# Patient Record
Sex: Male | Born: 1935 | Race: White | Hispanic: No | Marital: Married
Health system: Southern US, Community
[De-identification: ages and names within clinical notes are randomized; demographics above are authoritative.]

---

## 2011-09-03 ENCOUNTER — Inpatient Hospital Stay: Payer: Self-pay | Admitting: Internal Medicine

## 2011-09-03 DIAGNOSIS — I359 Nonrheumatic aortic valve disorder, unspecified: Secondary | ICD-10-CM

## 2011-09-03 LAB — TSH: Thyroid Stimulating Horm: 2.72 u[IU]/mL

## 2011-09-03 LAB — BASIC METABOLIC PANEL
Anion Gap: 11 (ref 7–16)
BUN: 12 mg/dL (ref 7–18)
Calcium, Total: 10 mg/dL (ref 8.5–10.1)
Chloride: 106 mmol/L (ref 98–107)
Co2: 24 mmol/L (ref 21–32)
Creatinine: 1.13 mg/dL (ref 0.60–1.30)
Glucose: 104 mg/dL — ABNORMAL HIGH (ref 65–99)
Sodium: 141 mmol/L (ref 136–145)

## 2011-09-03 LAB — CBC WITH DIFFERENTIAL/PLATELET
Basophil #: 0 10*3/uL (ref 0.0–0.1)
Eosinophil #: 0.1 10*3/uL (ref 0.0–0.7)
Eosinophil %: 0.5 %
HCT: 44 % (ref 40.0–52.0)
HGB: 14.8 g/dL (ref 13.0–18.0)
Lymphocyte #: 1.7 10*3/uL (ref 1.0–3.6)
MCHC: 33.6 g/dL (ref 32.0–36.0)
MCV: 93 fL (ref 80–100)
Monocyte #: 1.9 x10 3/mm — ABNORMAL HIGH (ref 0.2–1.0)
Neutrophil #: 9.3 10*3/uL — ABNORMAL HIGH (ref 1.4–6.5)
Neutrophil %: 71.5 %
Platelet: 149 10*3/uL — ABNORMAL LOW (ref 150–440)
RBC: 4.74 10*6/uL (ref 4.40–5.90)
RDW: 12.8 % (ref 11.5–14.5)
WBC: 13 10*3/uL — ABNORMAL HIGH (ref 3.8–10.6)

## 2011-09-03 LAB — CK TOTAL AND CKMB (NOT AT ARMC)
CK, Total: 56 U/L (ref 35–232)
CK-MB: 1 ng/mL (ref 0.5–3.6)

## 2011-09-03 LAB — LIPID PANEL
Cholesterol: 163 mg/dL (ref 0–200)
Triglycerides: 69 mg/dL (ref 0–200)
VLDL Cholesterol, Calc: 14 mg/dL (ref 5–40)

## 2011-09-09 ENCOUNTER — Encounter: Payer: Self-pay | Admitting: Internal Medicine

## 2011-09-26 ENCOUNTER — Encounter: Payer: Self-pay | Admitting: Internal Medicine

## 2012-04-15 ENCOUNTER — Ambulatory Visit: Payer: Self-pay | Admitting: Internal Medicine

## 2012-12-27 ENCOUNTER — Emergency Department: Payer: Self-pay | Admitting: Emergency Medicine

## 2012-12-27 LAB — COMPREHENSIVE METABOLIC PANEL
Albumin: 3.7 g/dL (ref 3.4–5.0)
Alkaline Phosphatase: 121 U/L (ref 50–136)
Anion Gap: 5 — ABNORMAL LOW (ref 7–16)
Bilirubin,Total: 0.5 mg/dL (ref 0.2–1.0)
Calcium, Total: 10 mg/dL (ref 8.5–10.1)
Creatinine: 1.15 mg/dL (ref 0.60–1.30)
EGFR (African American): >60
EGFR (Non-African Amer.): >60
Glucose: 146 mg/dL — ABNORMAL HIGH (ref 65–99)
Osmolality: 284 (ref 275–301)
Potassium: 3.9 mmol/L (ref 3.5–5.1)
SGPT (ALT): 22 U/L (ref 12–78)
Sodium: 139 mmol/L (ref 136–145)
Total Protein: 6.9 g/dL (ref 6.4–8.2)

## 2012-12-27 LAB — CBC
HCT: 43.5 % (ref 40.0–52.0)
HGB: 14.6 g/dL (ref 13.0–18.0)
MCH: 31.4 pg (ref 26.0–34.0)
MCHC: 33.7 g/dL (ref 32.0–36.0)
RBC: 4.67 10*6/uL (ref 4.40–5.90)
RDW: 12.9 % (ref 11.5–14.5)
WBC: 11.2 10*3/uL — ABNORMAL HIGH (ref 3.8–10.6)

## 2013-02-02 ENCOUNTER — Ambulatory Visit: Payer: Self-pay | Admitting: Ophthalmology

## 2013-03-27 ENCOUNTER — Inpatient Hospital Stay: Payer: Self-pay | Admitting: Internal Medicine

## 2013-03-27 LAB — COMPREHENSIVE METABOLIC PANEL
Albumin: 3 g/dL — ABNORMAL LOW (ref 3.4–5.0)
Alkaline Phosphatase: 72 U/L
BUN: 53 mg/dL — ABNORMAL HIGH (ref 7–18)
Bilirubin,Total: 0.9 mg/dL (ref 0.2–1.0)
Creatinine: 2.24 mg/dL — ABNORMAL HIGH (ref 0.60–1.30)
EGFR (African American): 31 — ABNORMAL LOW
Osmolality: 289 (ref 275–301)
SGOT(AST): 48 U/L — ABNORMAL HIGH (ref 15–37)
Sodium: 138 mmol/L (ref 136–145)

## 2013-03-27 LAB — URINALYSIS, COMPLETE
Bilirubin,UR: NEGATIVE
Glucose,UR: NEGATIVE mg/dL (ref 0–75)
Hyaline Cast: 18
Ketone: NEGATIVE
Nitrite: NEGATIVE
Protein: 100
RBC,UR: 64 /HPF (ref 0–5)
Specific Gravity: 1.012 (ref 1.003–1.030)
Squamous Epithelial: <1

## 2013-03-27 LAB — CBC WITH DIFFERENTIAL/PLATELET
Basophil #: 0.1 10*3/uL (ref 0.0–0.1)
Basophil %: 0.2 %
Eosinophil #: 0 10*3/uL (ref 0.0–0.7)
HGB: 14.9 g/dL (ref 13.0–18.0)
Lymphocyte #: 0.7 10*3/uL — ABNORMAL LOW (ref 1.0–3.6)
Lymphocyte %: 2.5 %
MCH: 30.7 pg (ref 26.0–34.0)
MCHC: 33.6 g/dL (ref 32.0–36.0)
MCV: 91 fL (ref 80–100)
Monocyte %: 7.6 %
Neutrophil #: 24.1 10*3/uL — ABNORMAL HIGH (ref 1.4–6.5)
Neutrophil %: 89.7 %
Platelet: 91 10*3/uL — ABNORMAL LOW (ref 150–440)
RDW: 13.7 % (ref 11.5–14.5)

## 2013-03-27 LAB — APTT: Activated PTT: >160 secs (ref 23.6–35.9)

## 2013-03-27 LAB — TROPONIN I
Troponin-I: 0.29 ng/mL — ABNORMAL HIGH
Troponin-I: 0.25 ng/mL — ABNORMAL HIGH
Troponin-I: 0.2 ng/mL — ABNORMAL HIGH

## 2013-03-28 LAB — CBC WITH DIFFERENTIAL/PLATELET
Basophil #: 0 10*3/uL (ref 0.0–0.1)
Basophil %: 0.3 %
Eosinophil #: 0 10*3/uL (ref 0.0–0.7)
Eosinophil %: 0 %
HCT: 34.4 % — ABNORMAL LOW (ref 40.0–52.0)
HGB: 11.8 g/dL — ABNORMAL LOW (ref 13.0–18.0)
Lymphocyte #: 0.3 10*3/uL — ABNORMAL LOW (ref 1.0–3.6)
Lymphocyte %: 1.7 %
MCH: 31.5 pg (ref 26.0–34.0)
Monocyte #: 0.9 x10 3/mm (ref 0.2–1.0)
Monocyte %: 5.7 %
Neutrophil #: 14 10*3/uL — ABNORMAL HIGH (ref 1.4–6.5)
Neutrophil %: 92.3 %
RBC: 3.76 10*6/uL — ABNORMAL LOW (ref 4.40–5.90)
RDW: 13.7 % (ref 11.5–14.5)
WBC: 15.2 10*3/uL — ABNORMAL HIGH (ref 3.8–10.6)

## 2013-03-28 LAB — BASIC METABOLIC PANEL
Anion Gap: 6 — ABNORMAL LOW (ref 7–16)
BUN: 43 mg/dL — ABNORMAL HIGH (ref 7–18)
EGFR (African American): 46 — ABNORMAL LOW
EGFR (Non-African Amer.): 39 — ABNORMAL LOW
Osmolality: 288 (ref 275–301)
Sodium: 138 mmol/L (ref 136–145)

## 2013-03-28 LAB — APTT
Activated PTT: 159.5 secs — ABNORMAL HIGH (ref 23.6–35.9)
Activated PTT: 106.6 secs — ABNORMAL HIGH (ref 23.6–35.9)

## 2013-03-29 LAB — COMPREHENSIVE METABOLIC PANEL
Alkaline Phosphatase: 42 U/L — ABNORMAL LOW
Anion Gap: 5 — ABNORMAL LOW (ref 7–16)
Bilirubin,Total: 0.4 mg/dL (ref 0.2–1.0)
Calcium, Total: 9.6 mg/dL (ref 8.5–10.1)
Chloride: 108 mmol/L — ABNORMAL HIGH (ref 98–107)
Co2: 27 mmol/L (ref 21–32)
Creatinine: 1.95 mg/dL — ABNORMAL HIGH (ref 0.60–1.30)
EGFR (African American): 37 — ABNORMAL LOW
EGFR (Non-African Amer.): 32 — ABNORMAL LOW
Osmolality: 291 (ref 275–301)
Potassium: 4.2 mmol/L (ref 3.5–5.1)
SGOT(AST): 24 U/L (ref 15–37)
SGPT (ALT): 29 U/L (ref 12–78)
Sodium: 140 mmol/L (ref 136–145)
Total Protein: 4.1 g/dL — ABNORMAL LOW (ref 6.4–8.2)

## 2013-03-29 LAB — APTT
Activated PTT: >160 secs (ref 23.6–35.9)
Activated PTT: 112.5 secs — ABNORMAL HIGH (ref 23.6–35.9)
Activated PTT: 113.6 secs — ABNORMAL HIGH (ref 23.6–35.9)

## 2013-03-30 LAB — HEMOGLOBIN: HGB: 6.1 g/dL — ABNORMAL LOW (ref 13.0–18.0)

## 2013-03-30 LAB — BASIC METABOLIC PANEL
Chloride: 109 mmol/L — ABNORMAL HIGH (ref 98–107)
Co2: 25 mmol/L (ref 21–32)
Glucose: 132 mg/dL — ABNORMAL HIGH (ref 65–99)
Potassium: 4.3 mmol/L (ref 3.5–5.1)
Sodium: 139 mmol/L (ref 136–145)

## 2013-03-30 LAB — PHOSPHORUS: Phosphorus: 3 mg/dL (ref 2.5–4.9)

## 2013-03-30 LAB — URIC ACID: Uric Acid: 9.1 mg/dL — ABNORMAL HIGH (ref 3.5–7.2)

## 2013-03-30 LAB — APTT: Activated PTT: 72.8 secs — ABNORMAL HIGH (ref 23.6–35.9)

## 2013-03-30 LAB — CK: CK, Total: 246 U/L — ABNORMAL HIGH (ref 35–232)

## 2013-03-31 LAB — PROTIME-INR: INR: 1.1

## 2013-03-31 LAB — RENAL FUNCTION PANEL
Albumin: 2.3 g/dL — ABNORMAL LOW (ref 3.4–5.0)
BUN: 40 mg/dL — ABNORMAL HIGH (ref 7–18)
Co2: 25 mmol/L (ref 21–32)
Creatinine: 1.84 mg/dL — ABNORMAL HIGH (ref 0.60–1.30)
EGFR (African American): 40 — ABNORMAL LOW
EGFR (Non-African Amer.): 34 — ABNORMAL LOW
Osmolality: 291 (ref 275–301)
Potassium: 4.4 mmol/L (ref 3.5–5.1)
Sodium: 141 mmol/L (ref 136–145)

## 2013-03-31 LAB — CBC WITH DIFFERENTIAL/PLATELET
Bands: 3 %
HCT: 25.8 % — ABNORMAL LOW (ref 40.0–52.0)
HCT: 23.6 % — ABNORMAL LOW (ref 40.0–52.0)
HGB: 8.4 g/dL — ABNORMAL LOW (ref 13.0–18.0)
MCH: 29.9 pg (ref 26.0–34.0)
MCH: 30.8 pg (ref 26.0–34.0)
MCHC: 32.7 g/dL (ref 32.0–36.0)
MCHC: 34.2 g/dL (ref 32.0–36.0)
Metamyelocyte: 1 %
Metamyelocyte: 1 %
Myelocyte: 2 %
Platelet: 87 10*3/uL — ABNORMAL LOW (ref 150–440)
RBC: 2.82 10*6/uL — ABNORMAL LOW (ref 4.40–5.90)
RBC: 2.62 10*6/uL — ABNORMAL LOW (ref 4.40–5.90)
RDW: 13.9 % (ref 11.5–14.5)
Segmented Neutrophils: 87 %
Segmented Neutrophils: 87 %
WBC: 15.7 10*3/uL — ABNORMAL HIGH (ref 3.8–10.6)

## 2013-03-31 LAB — PROTEIN ELECTROPHORESIS(ARMC)

## 2013-03-31 LAB — URINALYSIS, COMPLETE
Bilirubin,UR: NEGATIVE
Glucose,UR: NEGATIVE mg/dL (ref 0–75)
Ketone: NEGATIVE
Leukocyte Esterase: NEGATIVE
Ph: 5 (ref 4.5–8.0)
Protein: NEGATIVE
RBC,UR: 2 /HPF (ref 0–5)
Specific Gravity: 1.006 (ref 1.003–1.030)
Squamous Epithelial: NONE SEEN
WBC UR: 1 /HPF (ref 0–5)

## 2013-04-01 LAB — CBC WITH DIFFERENTIAL/PLATELET
Basophil #: 0 10*3/uL (ref 0.0–0.1)
Eosinophil #: 0 10*3/uL (ref 0.0–0.7)
Eosinophil %: 0 %
HCT: 20.3 % — ABNORMAL LOW (ref 40.0–52.0)
Lymphocyte #: 0.4 10*3/uL — ABNORMAL LOW (ref 1.0–3.6)
MCHC: 33.8 g/dL (ref 32.0–36.0)
MCV: 91 fL (ref 80–100)
Monocyte #: 0.7 x10 3/mm (ref 0.2–1.0)
Monocyte %: 6.5 %
Platelet: 77 10*3/uL — ABNORMAL LOW (ref 150–440)
RBC: 2.24 10*6/uL — ABNORMAL LOW (ref 4.40–5.90)
RDW: 14.1 % (ref 11.5–14.5)
WBC: 11.1 10*3/uL — ABNORMAL HIGH (ref 3.8–10.6)

## 2013-04-01 LAB — RENAL FUNCTION PANEL
Albumin: 1.9 g/dL — ABNORMAL LOW (ref 3.4–5.0)
Anion Gap: 4 — ABNORMAL LOW (ref 7–16)
Calcium, Total: 9.7 mg/dL (ref 8.5–10.1)
Chloride: 109 mmol/L — ABNORMAL HIGH (ref 98–107)
Co2: 27 mmol/L (ref 21–32)
Creatinine: 1.4 mg/dL — ABNORMAL HIGH (ref 0.60–1.30)
EGFR (African American): 55 — ABNORMAL LOW
EGFR (Non-African Amer.): 48 — ABNORMAL LOW
Osmolality: 286 (ref 275–301)
Phosphorus: 2.3 mg/dL — ABNORMAL LOW (ref 2.5–4.9)
Potassium: 4.2 mmol/L (ref 3.5–5.1)

## 2013-04-01 LAB — COMPREHENSIVE METABOLIC PANEL
Alkaline Phosphatase: 40 U/L — ABNORMAL LOW
Bilirubin,Total: 0.5 mg/dL (ref 0.2–1.0)
SGOT(AST): 49 U/L — ABNORMAL HIGH (ref 15–37)

## 2013-04-01 LAB — CULTURE, BLOOD (SINGLE)

## 2013-04-02 LAB — BASIC METABOLIC PANEL
Anion Gap: 3 — ABNORMAL LOW (ref 7–16)
Chloride: 110 mmol/L — ABNORMAL HIGH (ref 98–107)
Creatinine: 1.28 mg/dL (ref 0.60–1.30)
EGFR (African American): >60
Osmolality: 287 (ref 275–301)
Sodium: 140 mmol/L (ref 136–145)

## 2013-04-02 LAB — CBC WITH DIFFERENTIAL/PLATELET
Basophil #: 0 10*3/uL (ref 0.0–0.1)
Eosinophil #: 0 10*3/uL (ref 0.0–0.7)
Eosinophil %: 0 %
Lymphocyte %: 2.7 %
MCV: 90 fL (ref 80–100)
Monocyte #: 0.9 x10 3/mm (ref 0.2–1.0)
Monocyte %: 7 %
Neutrophil #: 10.9 10*3/uL — ABNORMAL HIGH (ref 1.4–6.5)
RBC: 3.06 10*6/uL — ABNORMAL LOW (ref 4.40–5.90)
WBC: 12.1 10*3/uL — ABNORMAL HIGH (ref 3.8–10.6)

## 2013-04-03 LAB — CBC WITH DIFFERENTIAL/PLATELET
Bands: 3 %
HGB: 9.8 g/dL — ABNORMAL LOW (ref 13.0–18.0)
MCH: 31.2 pg (ref 26.0–34.0)
Monocytes: 7 %
Myelocyte: 1 %
RBC: 3.13 10*6/uL — ABNORMAL LOW (ref 4.40–5.90)
Segmented Neutrophils: 82 %
WBC: 13.1 10*3/uL — ABNORMAL HIGH (ref 3.8–10.6)

## 2013-04-03 LAB — BASIC METABOLIC PANEL
Anion Gap: 5 — ABNORMAL LOW (ref 7–16)
Calcium, Total: 9.8 mg/dL (ref 8.5–10.1)
Co2: 25 mmol/L (ref 21–32)
EGFR (African American): >60
Glucose: 98 mg/dL (ref 65–99)
Potassium: 3.9 mmol/L (ref 3.5–5.1)

## 2013-04-04 LAB — CBC WITH DIFFERENTIAL/PLATELET
Eosinophil #: 0 10*3/uL (ref 0.0–0.7)
HCT: 31.4 % — ABNORMAL LOW (ref 40.0–52.0)
Lymphocyte #: 0.2 10*3/uL — ABNORMAL LOW (ref 1.0–3.6)
Lymphocyte %: 1.7 %
MCHC: 34.2 g/dL (ref 32.0–36.0)
MCV: 91 fL (ref 80–100)
Neutrophil #: 13.1 10*3/uL — ABNORMAL HIGH (ref 1.4–6.5)
Neutrophil %: 93.1 %
Platelet: 105 10*3/uL — ABNORMAL LOW (ref 150–440)
RBC: 3.47 10*6/uL — ABNORMAL LOW (ref 4.40–5.90)
WBC: 14.1 10*3/uL — ABNORMAL HIGH (ref 3.8–10.6)

## 2013-04-04 LAB — BASIC METABOLIC PANEL
Anion Gap: 6 — ABNORMAL LOW (ref 7–16)
BUN: 25 mg/dL — ABNORMAL HIGH (ref 7–18)
Calcium, Total: 10.2 mg/dL — ABNORMAL HIGH (ref 8.5–10.1)
Co2: 26 mmol/L (ref 21–32)
Creatinine: 1.24 mg/dL (ref 0.60–1.30)
EGFR (African American): >60
EGFR (Non-African Amer.): 55 — ABNORMAL LOW
Glucose: 96 mg/dL (ref 65–99)
Potassium: 4.2 mmol/L (ref 3.5–5.1)

## 2013-04-06 ENCOUNTER — Inpatient Hospital Stay: Payer: Self-pay | Admitting: Internal Medicine

## 2013-04-06 LAB — URINALYSIS, COMPLETE
Bilirubin,UR: NEGATIVE
Glucose,UR: NEGATIVE mg/dL (ref 0–75)
Ketone: NEGATIVE
Nitrite: NEGATIVE
Ph: 5 (ref 4.5–8.0)
Protein: NEGATIVE
RBC,UR: 23 /HPF (ref 0–5)
Specific Gravity: 1.014 (ref 1.003–1.030)
Squamous Epithelial: NONE SEEN
WBC UR: 1 /HPF (ref 0–5)

## 2013-04-06 LAB — COMPREHENSIVE METABOLIC PANEL
Albumin: 2.8 g/dL — ABNORMAL LOW (ref 3.4–5.0)
Alkaline Phosphatase: 72 U/L
BUN: 32 mg/dL — ABNORMAL HIGH (ref 7–18)
Calcium, Total: 10.9 mg/dL — ABNORMAL HIGH (ref 8.5–10.1)
Creatinine: 2.06 mg/dL — ABNORMAL HIGH (ref 0.60–1.30)
EGFR (Non-African Amer.): 30 — ABNORMAL LOW
Glucose: 138 mg/dL — ABNORMAL HIGH (ref 65–99)
Osmolality: 296 (ref 275–301)
SGPT (ALT): 37 U/L (ref 12–78)
Sodium: 144 mmol/L (ref 136–145)

## 2013-04-06 LAB — CBC WITH DIFFERENTIAL/PLATELET
Basophil %: 0.4 %
Eosinophil #: 0 10*3/uL (ref 0.0–0.7)
Eosinophil %: 0 %
HGB: 12.1 g/dL — ABNORMAL LOW (ref 13.0–18.0)
Lymphocyte #: 0.4 10*3/uL — ABNORMAL LOW (ref 1.0–3.6)
MCH: 30.4 pg (ref 26.0–34.0)
MCHC: 33 g/dL (ref 32.0–36.0)
Monocyte #: 1.8 x10 3/mm — ABNORMAL HIGH (ref 0.2–1.0)
Neutrophil %: 91.2 %
Platelet: 124 10*3/uL — ABNORMAL LOW (ref 150–440)
RDW: 15.1 % — ABNORMAL HIGH (ref 11.5–14.5)
WBC: 26.4 10*3/uL — ABNORMAL HIGH (ref 3.8–10.6)

## 2013-04-06 LAB — TROPONIN I: Troponin-I: 0.21 ng/mL — ABNORMAL HIGH

## 2013-04-07 ENCOUNTER — Ambulatory Visit: Payer: Self-pay | Admitting: Internal Medicine

## 2013-04-07 LAB — COMPREHENSIVE METABOLIC PANEL
Albumin: 2.5 g/dL — ABNORMAL LOW (ref 3.4–5.0)
Alkaline Phosphatase: 67 U/L
BUN: 40 mg/dL — ABNORMAL HIGH (ref 7–18)
Bilirubin,Total: 1.4 mg/dL — ABNORMAL HIGH (ref 0.2–1.0)
Calcium, Total: 9.9 mg/dL (ref 8.5–10.1)
Chloride: 113 mmol/L — ABNORMAL HIGH (ref 98–107)
Co2: 20 mmol/L — ABNORMAL LOW (ref 21–32)
Creatinine: 2.48 mg/dL — ABNORMAL HIGH (ref 0.60–1.30)
EGFR (Non-African Amer.): 24 — ABNORMAL LOW
Glucose: 151 mg/dL — ABNORMAL HIGH (ref 65–99)
Potassium: 3.7 mmol/L (ref 3.5–5.1)
Sodium: 143 mmol/L (ref 136–145)

## 2013-04-07 LAB — PROTIME-INR
INR: 1.4
Prothrombin Time: 16.9 secs — ABNORMAL HIGH (ref 11.5–14.7)

## 2013-04-07 LAB — CBC WITH DIFFERENTIAL/PLATELET
Basophil #: 0.2 10*3/uL — ABNORMAL HIGH (ref 0.0–0.1)
Basophil %: 0.5 %
Eosinophil %: 0 %
HGB: 11.2 g/dL — ABNORMAL LOW (ref 13.0–18.0)
Lymphocyte #: 0.3 10*3/uL — ABNORMAL LOW (ref 1.0–3.6)
MCH: 30.6 pg (ref 26.0–34.0)
MCHC: 32.9 g/dL (ref 32.0–36.0)
MCV: 93 fL (ref 80–100)
Monocyte #: 1.4 x10 3/mm — ABNORMAL HIGH (ref 0.2–1.0)
Monocyte %: 4.5 %
Neutrophil #: 30 10*3/uL — ABNORMAL HIGH (ref 1.4–6.5)
Neutrophil %: 94.2 %
Platelet: 96 10*3/uL — ABNORMAL LOW (ref 150–440)
RBC: 3.66 10*6/uL — ABNORMAL LOW (ref 4.40–5.90)

## 2013-04-07 LAB — TROPONIN I
Troponin-I: 0.21 ng/mL — ABNORMAL HIGH
Troponin-I: 0.22 ng/mL — ABNORMAL HIGH

## 2013-04-08 LAB — BASIC METABOLIC PANEL
BUN: 52 mg/dL — ABNORMAL HIGH (ref 7–18)
Co2: 21 mmol/L (ref 21–32)
Creatinine: 2.58 mg/dL — ABNORMAL HIGH (ref 0.60–1.30)
EGFR (African American): 26 — ABNORMAL LOW
Glucose: 115 mg/dL — ABNORMAL HIGH (ref 65–99)

## 2013-04-08 LAB — CBC WITH DIFFERENTIAL/PLATELET
Bands: 10 %
HCT: 34.6 % — ABNORMAL LOW (ref 40.0–52.0)
MCV: 93 fL (ref 80–100)
Monocytes: 11 %
Segmented Neutrophils: 75 %
WBC: 33 10*3/uL — ABNORMAL HIGH (ref 3.8–10.6)

## 2013-04-08 LAB — URINE CULTURE

## 2013-04-09 LAB — CBC WITH DIFFERENTIAL/PLATELET
Eosinophil #: 0.1 10*3/uL (ref 0.0–0.7)
Eosinophil %: 0.3 %
HGB: 11.1 g/dL — ABNORMAL LOW (ref 13.0–18.0)
Lymphocyte #: 0.3 10*3/uL — ABNORMAL LOW (ref 1.0–3.6)
Lymphocyte %: 1.2 %
MCH: 31.4 pg (ref 26.0–34.0)
MCHC: 33.4 g/dL (ref 32.0–36.0)
Monocyte #: 1.1 x10 3/mm — ABNORMAL HIGH (ref 0.2–1.0)
Neutrophil #: 23 10*3/uL — ABNORMAL HIGH (ref 1.4–6.5)
RBC: 3.53 10*6/uL — ABNORMAL LOW (ref 4.40–5.90)
RDW: 15.4 % — ABNORMAL HIGH (ref 11.5–14.5)
WBC: 24.7 10*3/uL — ABNORMAL HIGH (ref 3.8–10.6)

## 2013-04-09 LAB — BASIC METABOLIC PANEL
BUN: 59 mg/dL — ABNORMAL HIGH (ref 7–18)
Calcium, Total: 10.7 mg/dL — ABNORMAL HIGH (ref 8.5–10.1)
Chloride: 116 mmol/L — ABNORMAL HIGH (ref 98–107)
EGFR (African American): 27 — ABNORMAL LOW
EGFR (Non-African Amer.): 24 — ABNORMAL LOW
Glucose: 112 mg/dL — ABNORMAL HIGH (ref 65–99)
Osmolality: 306 (ref 275–301)
Potassium: 3.8 mmol/L (ref 3.5–5.1)

## 2013-04-10 LAB — BASIC METABOLIC PANEL
Anion Gap: 7 (ref 7–16)
BUN: 53 mg/dL — ABNORMAL HIGH (ref 7–18)
Chloride: 116 mmol/L — ABNORMAL HIGH (ref 98–107)
Co2: 22 mmol/L (ref 21–32)
Creatinine: 1.89 mg/dL — ABNORMAL HIGH (ref 0.60–1.30)
Glucose: 107 mg/dL — ABNORMAL HIGH (ref 65–99)
Potassium: 3.6 mmol/L (ref 3.5–5.1)

## 2013-04-10 LAB — CBC WITH DIFFERENTIAL/PLATELET
Eosinophil #: 0 10*3/uL (ref 0.0–0.7)
HGB: 11.1 g/dL — ABNORMAL LOW (ref 13.0–18.0)
Lymphocyte %: 1.3 %
MCH: 31.1 pg (ref 26.0–34.0)
MCHC: 33.2 g/dL (ref 32.0–36.0)
MCV: 94 fL (ref 80–100)
Neutrophil %: 92.5 %
Platelet: 58 10*3/uL — ABNORMAL LOW (ref 150–440)
RBC: 3.56 10*6/uL — ABNORMAL LOW (ref 4.40–5.90)

## 2013-04-11 LAB — CBC WITH DIFFERENTIAL/PLATELET
Basophil %: 0.2 %
Eosinophil #: 0 10*3/uL (ref 0.0–0.7)
Eosinophil %: 0 %
HGB: 10.7 g/dL — ABNORMAL LOW (ref 13.0–18.0)
Lymphocyte %: 1.8 %
MCH: 31.2 pg (ref 26.0–34.0)
MCHC: 33.4 g/dL (ref 32.0–36.0)
MCV: 93 fL (ref 80–100)
Monocyte #: 0.7 x10 3/mm (ref 0.2–1.0)
Monocyte %: 6.6 %
Neutrophil #: 10 10*3/uL — ABNORMAL HIGH (ref 1.4–6.5)
Neutrophil %: 91.4 %
RBC: 3.45 10*6/uL — ABNORMAL LOW (ref 4.40–5.90)
RDW: 15.7 % — ABNORMAL HIGH (ref 11.5–14.5)
WBC: 11 10*3/uL — ABNORMAL HIGH (ref 3.8–10.6)

## 2013-04-11 LAB — COMPREHENSIVE METABOLIC PANEL
Alkaline Phosphatase: 66 U/L
Anion Gap: 4 — ABNORMAL LOW (ref 7–16)
Calcium, Total: 11 mg/dL — ABNORMAL HIGH (ref 8.5–10.1)
Chloride: 117 mmol/L — ABNORMAL HIGH (ref 98–107)
Creatinine: 1.47 mg/dL — ABNORMAL HIGH (ref 0.60–1.30)
EGFR (African American): 52 — ABNORMAL LOW
Glucose: 115 mg/dL — ABNORMAL HIGH (ref 65–99)
SGOT(AST): 40 U/L — ABNORMAL HIGH (ref 15–37)
SGPT (ALT): 31 U/L (ref 12–78)
Sodium: 144 mmol/L (ref 136–145)
Total Protein: 5 g/dL — ABNORMAL LOW (ref 6.4–8.2)

## 2013-04-11 LAB — VANCOMYCIN, TROUGH: Vancomycin, Trough: 16 ug/mL (ref 10–20)

## 2013-04-11 LAB — CULTURE, BLOOD (SINGLE)

## 2013-04-12 LAB — CBC WITH DIFFERENTIAL/PLATELET
Basophil #: 0 10*3/uL (ref 0.0–0.1)
Basophil %: 0.4 %
Eosinophil #: 0 10*3/uL (ref 0.0–0.7)
Eosinophil %: 0 %
HGB: 10.2 g/dL — ABNORMAL LOW (ref 13.0–18.0)
Lymphocyte #: 0.2 10*3/uL — ABNORMAL LOW (ref 1.0–3.6)
MCH: 31.4 pg (ref 26.0–34.0)
MCHC: 34.2 g/dL (ref 32.0–36.0)
Monocyte %: 7.4 %
Neutrophil #: 10 10*3/uL — ABNORMAL HIGH (ref 1.4–6.5)
Platelet: 55 10*3/uL — ABNORMAL LOW (ref 150–440)
RBC: 3.24 10*6/uL — ABNORMAL LOW (ref 4.40–5.90)
RDW: 15.8 % — ABNORMAL HIGH (ref 11.5–14.5)
WBC: 11 10*3/uL — ABNORMAL HIGH (ref 3.8–10.6)

## 2013-04-12 LAB — BASIC METABOLIC PANEL
BUN: 35 mg/dL — ABNORMAL HIGH (ref 7–18)
Chloride: 116 mmol/L — ABNORMAL HIGH (ref 98–107)
Creatinine: 1.1 mg/dL (ref 0.60–1.30)
EGFR (African American): >60
EGFR (Non-African Amer.): >60
Osmolality: 298 (ref 275–301)
Potassium: 3.3 mmol/L — ABNORMAL LOW (ref 3.5–5.1)
Sodium: 145 mmol/L (ref 136–145)

## 2013-04-13 LAB — BASIC METABOLIC PANEL
Anion Gap: 6 — ABNORMAL LOW (ref 7–16)
BUN: 28 mg/dL — ABNORMAL HIGH (ref 7–18)
Calcium, Total: 9.8 mg/dL (ref 8.5–10.1)
Co2: 22 mmol/L (ref 21–32)
EGFR (Non-African Amer.): >60
Potassium: 3.1 mmol/L — ABNORMAL LOW (ref 3.5–5.1)
Sodium: 140 mmol/L (ref 136–145)

## 2013-04-13 LAB — CBC WITH DIFFERENTIAL/PLATELET
Basophil %: 0.1 %
Eosinophil #: 0 10*3/uL (ref 0.0–0.7)
Lymphocyte #: 0.2 10*3/uL — ABNORMAL LOW (ref 1.0–3.6)
Lymphocyte %: 2.6 %
MCHC: 34.1 g/dL (ref 32.0–36.0)
MCV: 93 fL (ref 80–100)
Monocyte #: 0.7 x10 3/mm (ref 0.2–1.0)
Monocyte %: 7.4 %
Neutrophil #: 8.6 10*3/uL — ABNORMAL HIGH (ref 1.4–6.5)
RBC: 3.03 10*6/uL — ABNORMAL LOW (ref 4.40–5.90)

## 2013-04-14 LAB — CBC WITH DIFFERENTIAL/PLATELET
Basophil #: 0 10*3/uL (ref 0.0–0.1)
Eosinophil %: 0.2 %
HGB: 10.8 g/dL — ABNORMAL LOW (ref 13.0–18.0)
Lymphocyte %: 3.7 %
MCH: 30.6 pg (ref 26.0–34.0)
MCHC: 33.3 g/dL (ref 32.0–36.0)
MCV: 92 fL (ref 80–100)
Monocyte #: 0.8 x10 3/mm (ref 0.2–1.0)
Neutrophil #: 11.4 10*3/uL — ABNORMAL HIGH (ref 1.4–6.5)
Neutrophil %: 90 %
Platelet: 76 10*3/uL — ABNORMAL LOW (ref 150–440)
RDW: 15.6 % — ABNORMAL HIGH (ref 11.5–14.5)
WBC: 12.7 10*3/uL — ABNORMAL HIGH (ref 3.8–10.6)

## 2013-04-14 LAB — MAGNESIUM: Magnesium: 1.6 mg/dL — ABNORMAL LOW

## 2013-04-14 LAB — BASIC METABOLIC PANEL
Anion Gap: 6 — ABNORMAL LOW (ref 7–16)
BUN: 24 mg/dL — ABNORMAL HIGH (ref 7–18)
Calcium, Total: 10.5 mg/dL — ABNORMAL HIGH (ref 8.5–10.1)
Chloride: 113 mmol/L — ABNORMAL HIGH (ref 98–107)
EGFR (African American): >60
Glucose: 126 mg/dL — ABNORMAL HIGH (ref 65–99)
Osmolality: 291 (ref 275–301)
Potassium: 3 mmol/L — ABNORMAL LOW (ref 3.5–5.1)
Sodium: 143 mmol/L (ref 136–145)

## 2013-04-14 LAB — POTASSIUM: Potassium: 4 mmol/L (ref 3.5–5.1)

## 2013-04-15 LAB — BASIC METABOLIC PANEL
Chloride: 114 mmol/L — ABNORMAL HIGH (ref 98–107)
Creatinine: 1 mg/dL (ref 0.60–1.30)
EGFR (African American): >60
EGFR (Non-African Amer.): >60
Osmolality: 288 (ref 275–301)
Potassium: 3.7 mmol/L (ref 3.5–5.1)

## 2013-04-15 LAB — CBC WITH DIFFERENTIAL/PLATELET
Basophil %: 0.1 %
Eosinophil %: 0.2 %
HCT: 30.8 % — ABNORMAL LOW (ref 40.0–52.0)
HGB: 10.3 g/dL — ABNORMAL LOW (ref 13.0–18.0)
Lymphocyte #: 0.3 10*3/uL — ABNORMAL LOW (ref 1.0–3.6)
MCH: 30.9 pg (ref 26.0–34.0)
MCHC: 33.6 g/dL (ref 32.0–36.0)
Monocyte #: 0.7 x10 3/mm (ref 0.2–1.0)
Monocyte %: 5.8 %
Neutrophil %: 91.3 %
Platelet: 69 10*3/uL — ABNORMAL LOW (ref 150–440)
WBC: 11.5 10*3/uL — ABNORMAL HIGH (ref 3.8–10.6)

## 2013-04-16 LAB — CBC WITH DIFFERENTIAL/PLATELET
Basophil #: 0 10*3/uL (ref 0.0–0.1)
Basophil %: 0.2 %
Eosinophil #: 0 10*3/uL (ref 0.0–0.7)
Eosinophil %: 0 %
HCT: 35.6 % — ABNORMAL LOW (ref 40.0–52.0)
HGB: 11.6 g/dL — ABNORMAL LOW (ref 13.0–18.0)
Lymphocyte %: 1.6 %
MCV: 93 fL (ref 80–100)
Monocyte %: 5.7 %
Neutrophil %: 92.5 %
RBC: 3.84 10*6/uL — ABNORMAL LOW (ref 4.40–5.90)
RDW: 16 % — ABNORMAL HIGH (ref 11.5–14.5)
WBC: 21.9 10*3/uL — ABNORMAL HIGH (ref 3.8–10.6)

## 2013-04-16 LAB — BASIC METABOLIC PANEL
Anion Gap: 5 — ABNORMAL LOW (ref 7–16)
BUN: 28 mg/dL — ABNORMAL HIGH (ref 7–18)
Calcium, Total: 11.2 mg/dL — ABNORMAL HIGH (ref 8.5–10.1)
Chloride: 112 mmol/L — ABNORMAL HIGH (ref 98–107)
EGFR (African American): 59 — ABNORMAL LOW
Glucose: 87 mg/dL (ref 65–99)
Osmolality: 290 (ref 275–301)
Potassium: 3.9 mmol/L (ref 3.5–5.1)
Sodium: 143 mmol/L (ref 136–145)

## 2013-04-27 ENCOUNTER — Ambulatory Visit: Payer: Self-pay | Admitting: Internal Medicine

## 2013-04-27 DEATH — deceased

## 2014-04-17 IMAGING — CT CT HEAD WITHOUT CONTRAST
1 series · 16 of 30 positions shown, 20 images · non-contrast
Comparison: 03/27/2013

CLINICAL DATA: Confusion. Left facial droop and altered mental
status.

EXAM:
CT HEAD WITHOUT CONTRAST
TECHNIQUE: Contiguous axial images were obtained from the base of the skull
through the vertex without intravenous contrast.

[Series 2: head wo · axial · 0.48mm/px · z∈[-11,+142]mm · 16 of 36 slices shown, 20 images]
[im 2/36  brain]
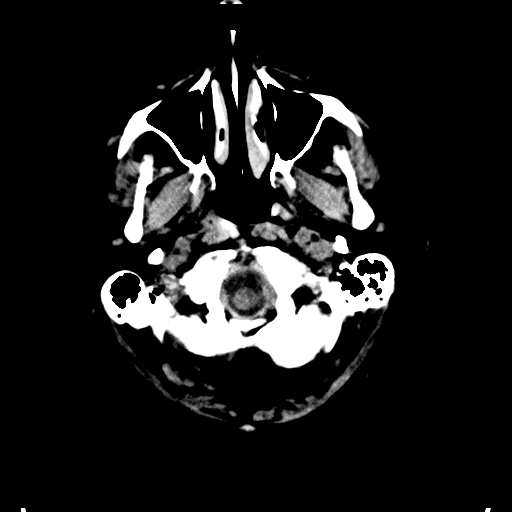
[im 2/36  bone]
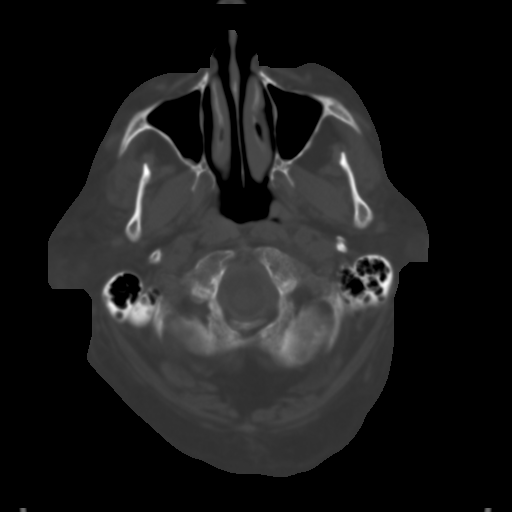
[im 4/36  brain]
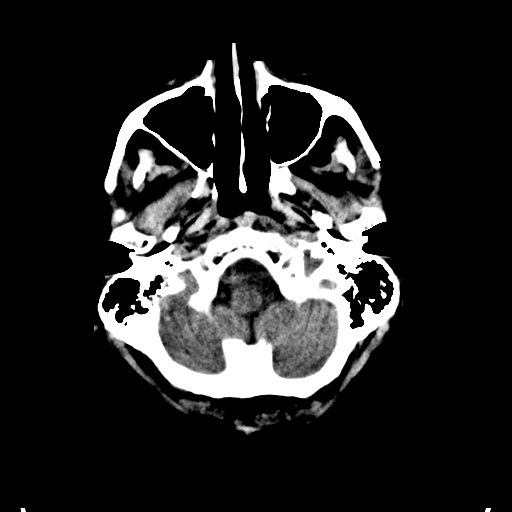
[im 7/36  brain]
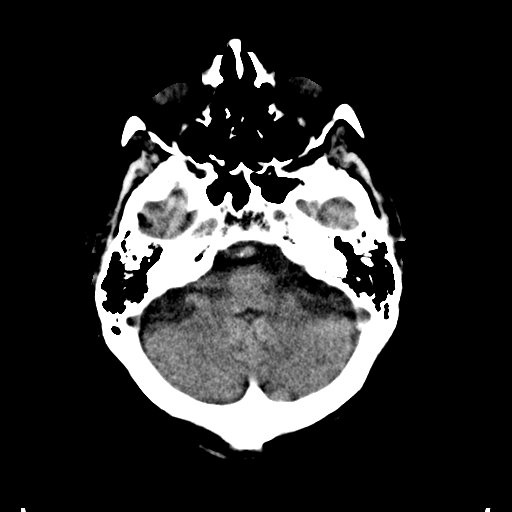
[im 9/36  brain]
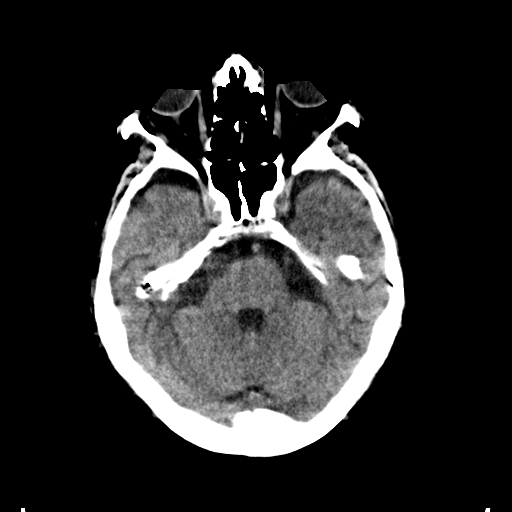
[im 10/36  brain]
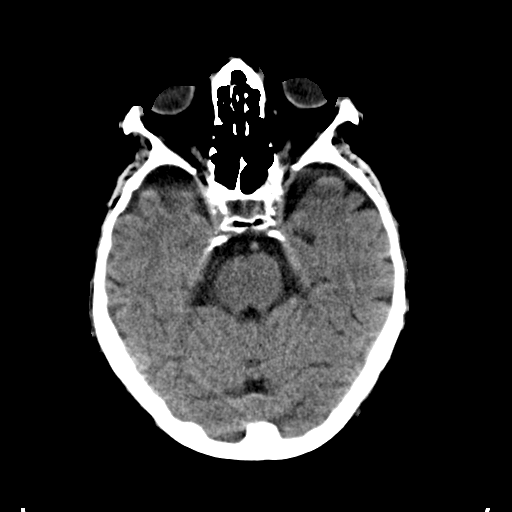
[im 10/36  bone]
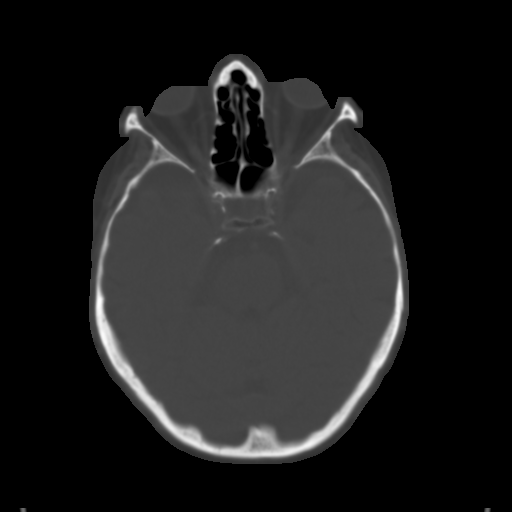
[im 13/36  brain]
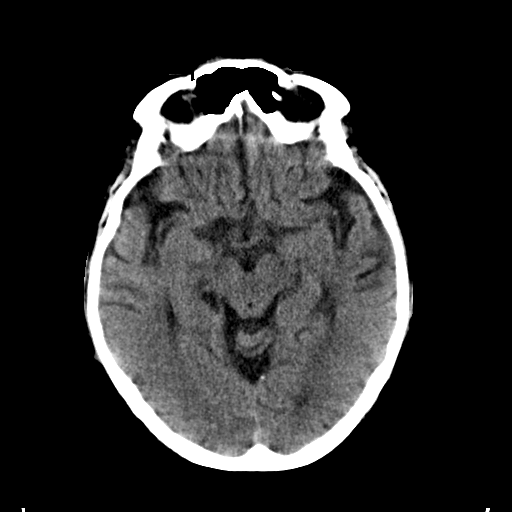
[im 15/36  brain]
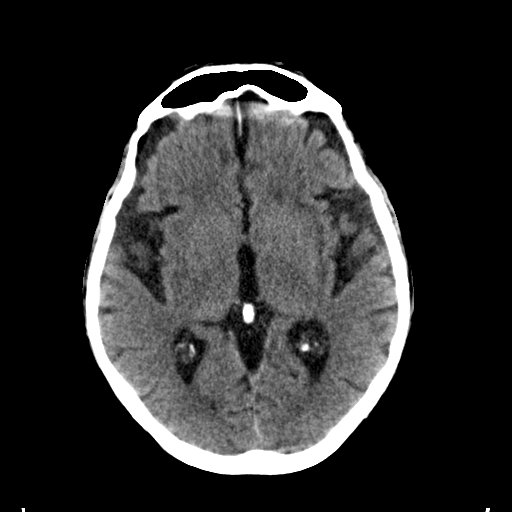
[im 17/36  brain]
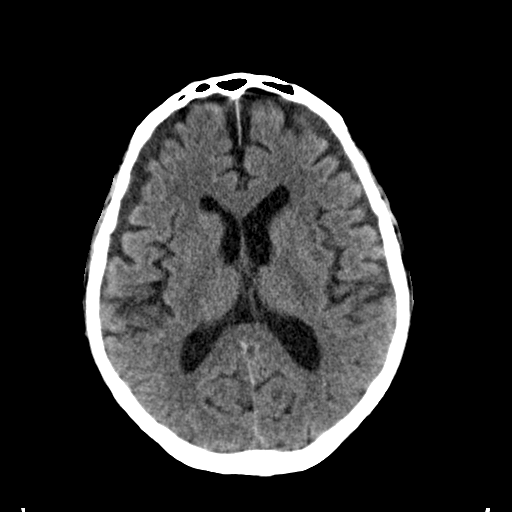
[im 19/36  brain]
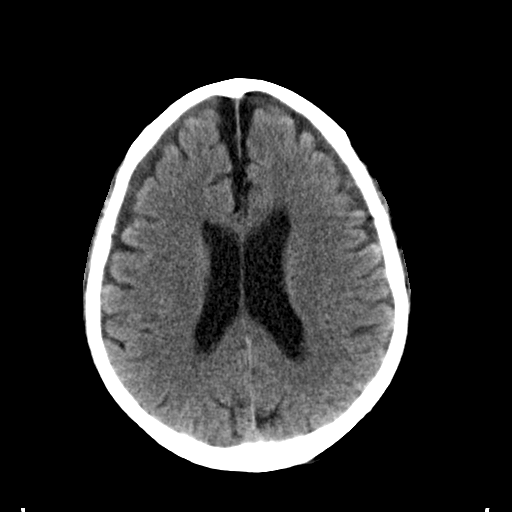
[im 19/36  bone]
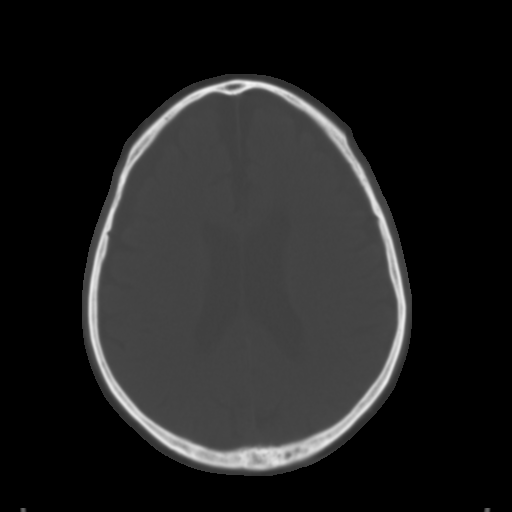
[im 21/36  brain]
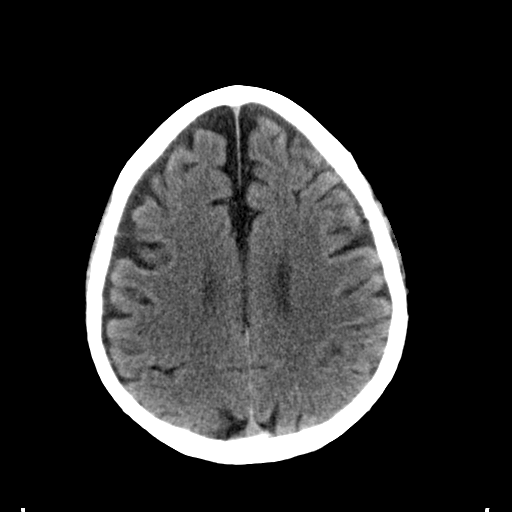
[im 23/36  brain]
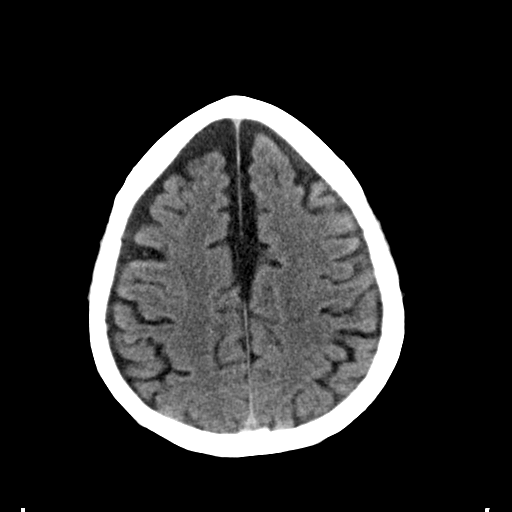
[im 26/36  brain]
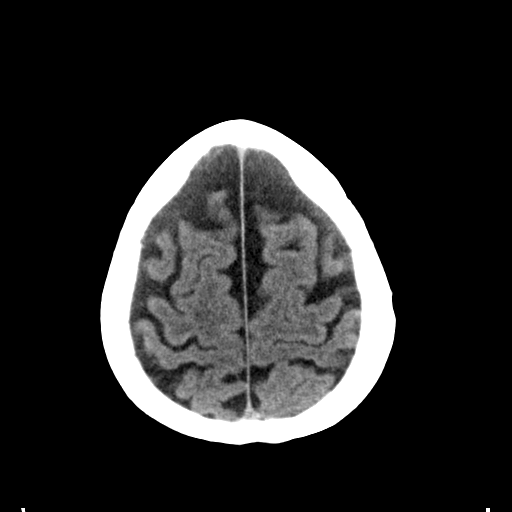
[im 27/36  brain]
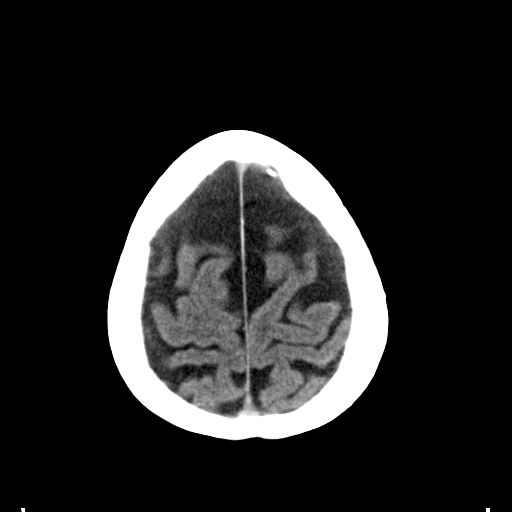
[im 27/36  bone]
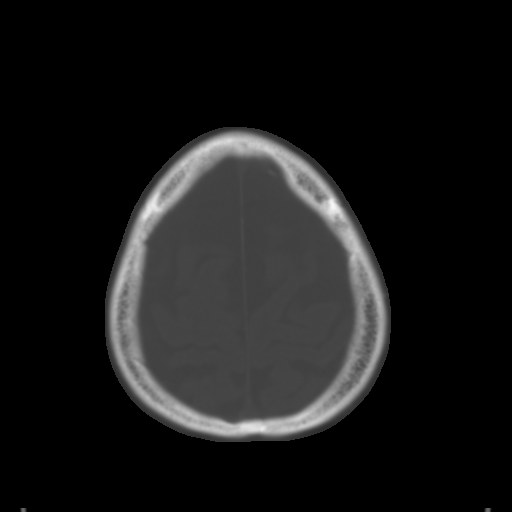
[im 29/36  brain]
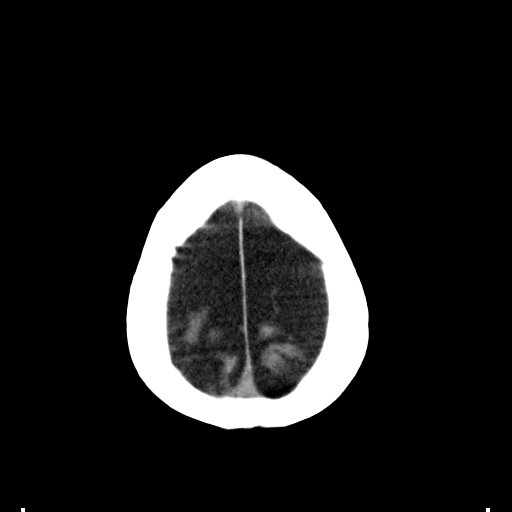
[im 32/36  brain]
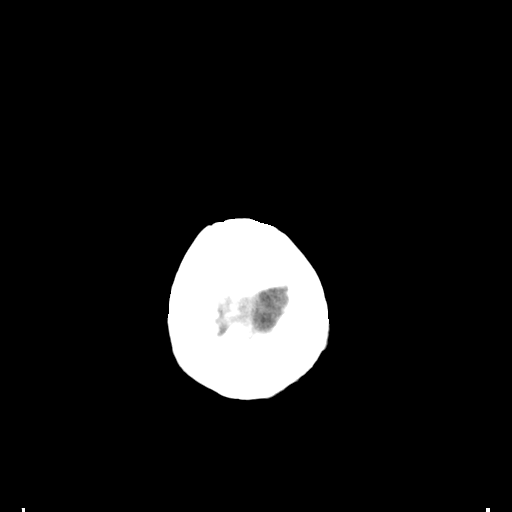
[im 34/36  brain]
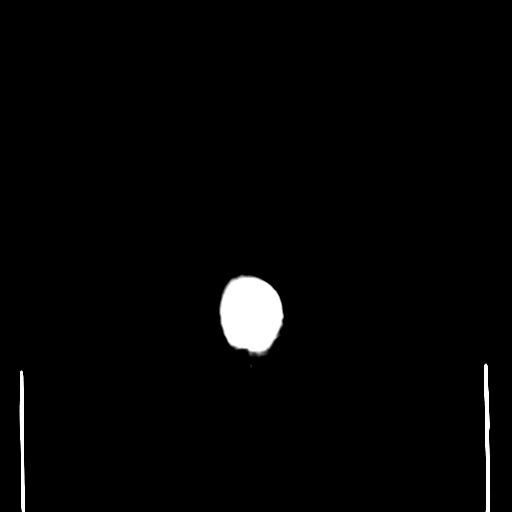

[16 of 30 positions shown; findings below may reference images not displayed]

FINDINGS: Moderate cerebral atrophy is unchanged. Small, remote right caudate
infarct is unchanged. There is no evidence of acute infarct, mass,
midline shift, intracranial hemorrhage, or extra-axial fluid
collection. Prior left cataract surgery is noted. The visualized
paranasal sinuses and mastoid air cells are unremarkable. No
fracture.
IMPRESSION: No acute intracranial abnormality.

## 2014-04-22 IMAGING — CR DG CHEST 1V PORT
1 series · 2 of 2 positions shown · non-contrast
Comparison: 04/06/2013

CLINICAL DATA: PICC line placement.

EXAM:
PORTABLE CHEST - 1 VIEW

[Series 1: ap · 0.17mm/px · 2 of 2 slices shown]
[im 1/2]
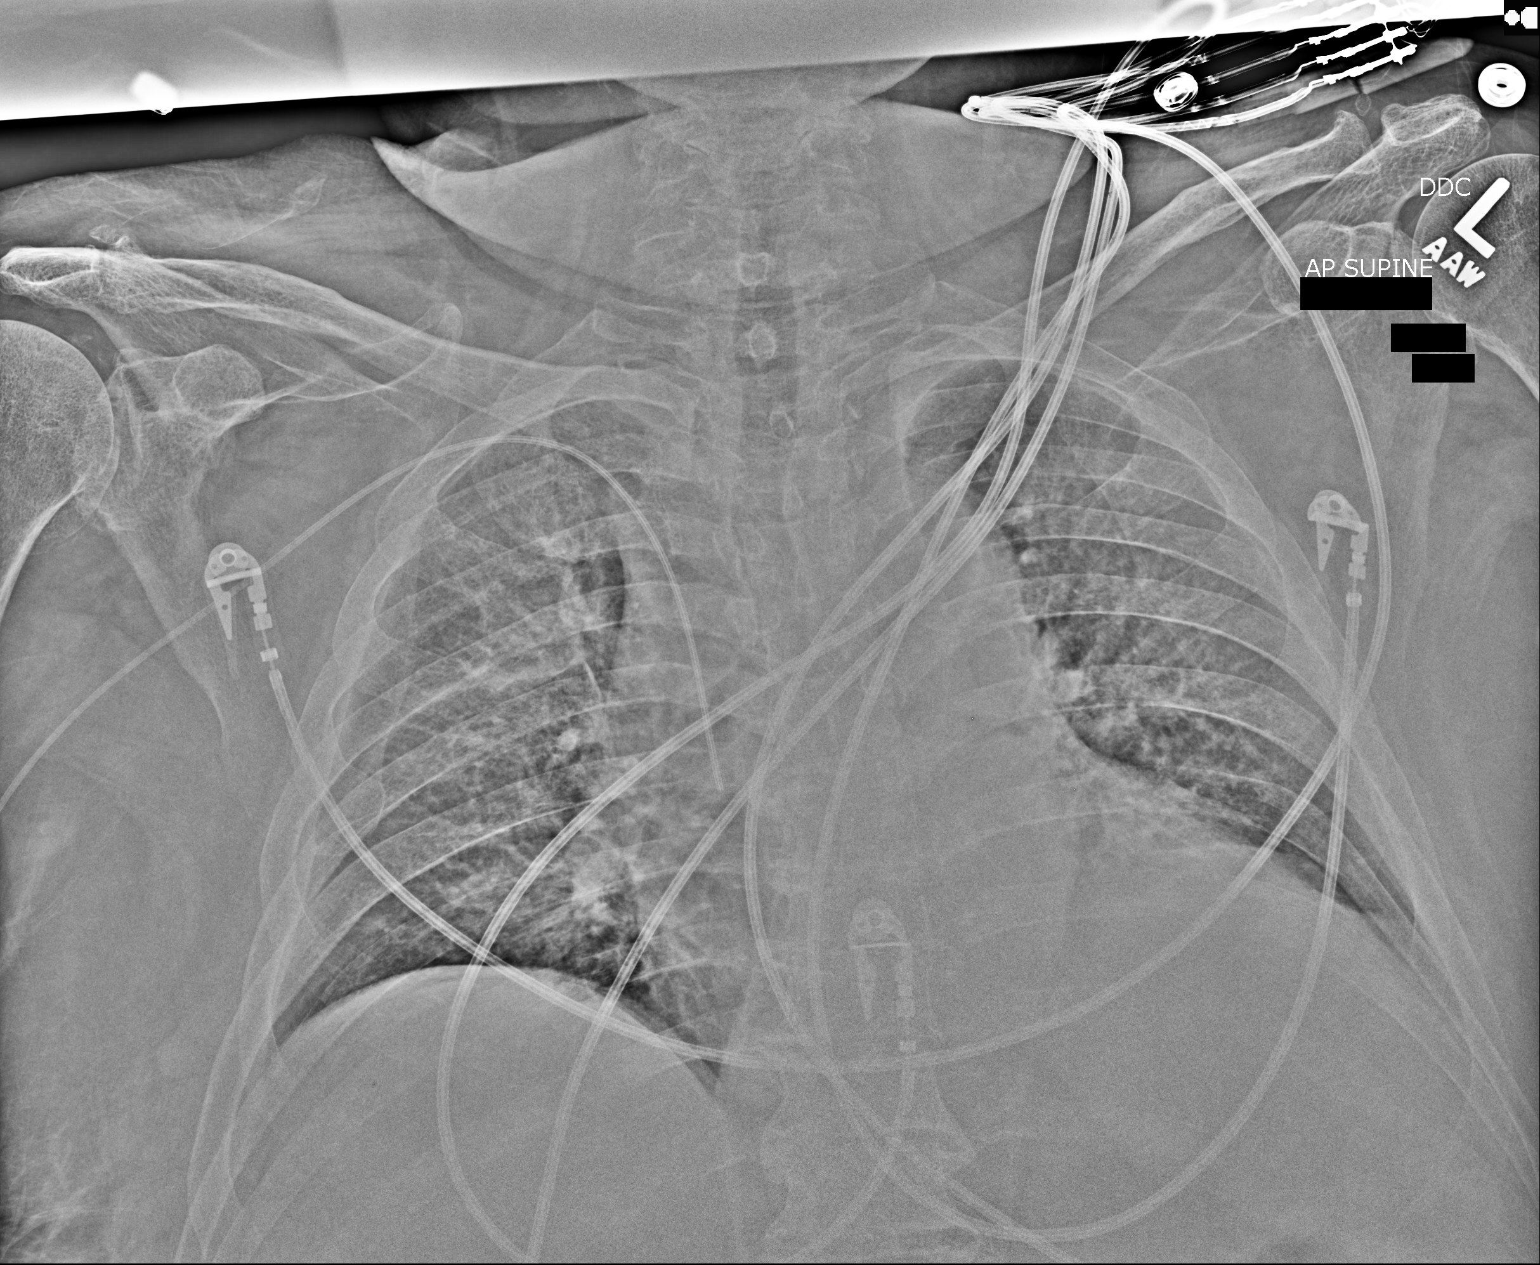
[im 2/2]
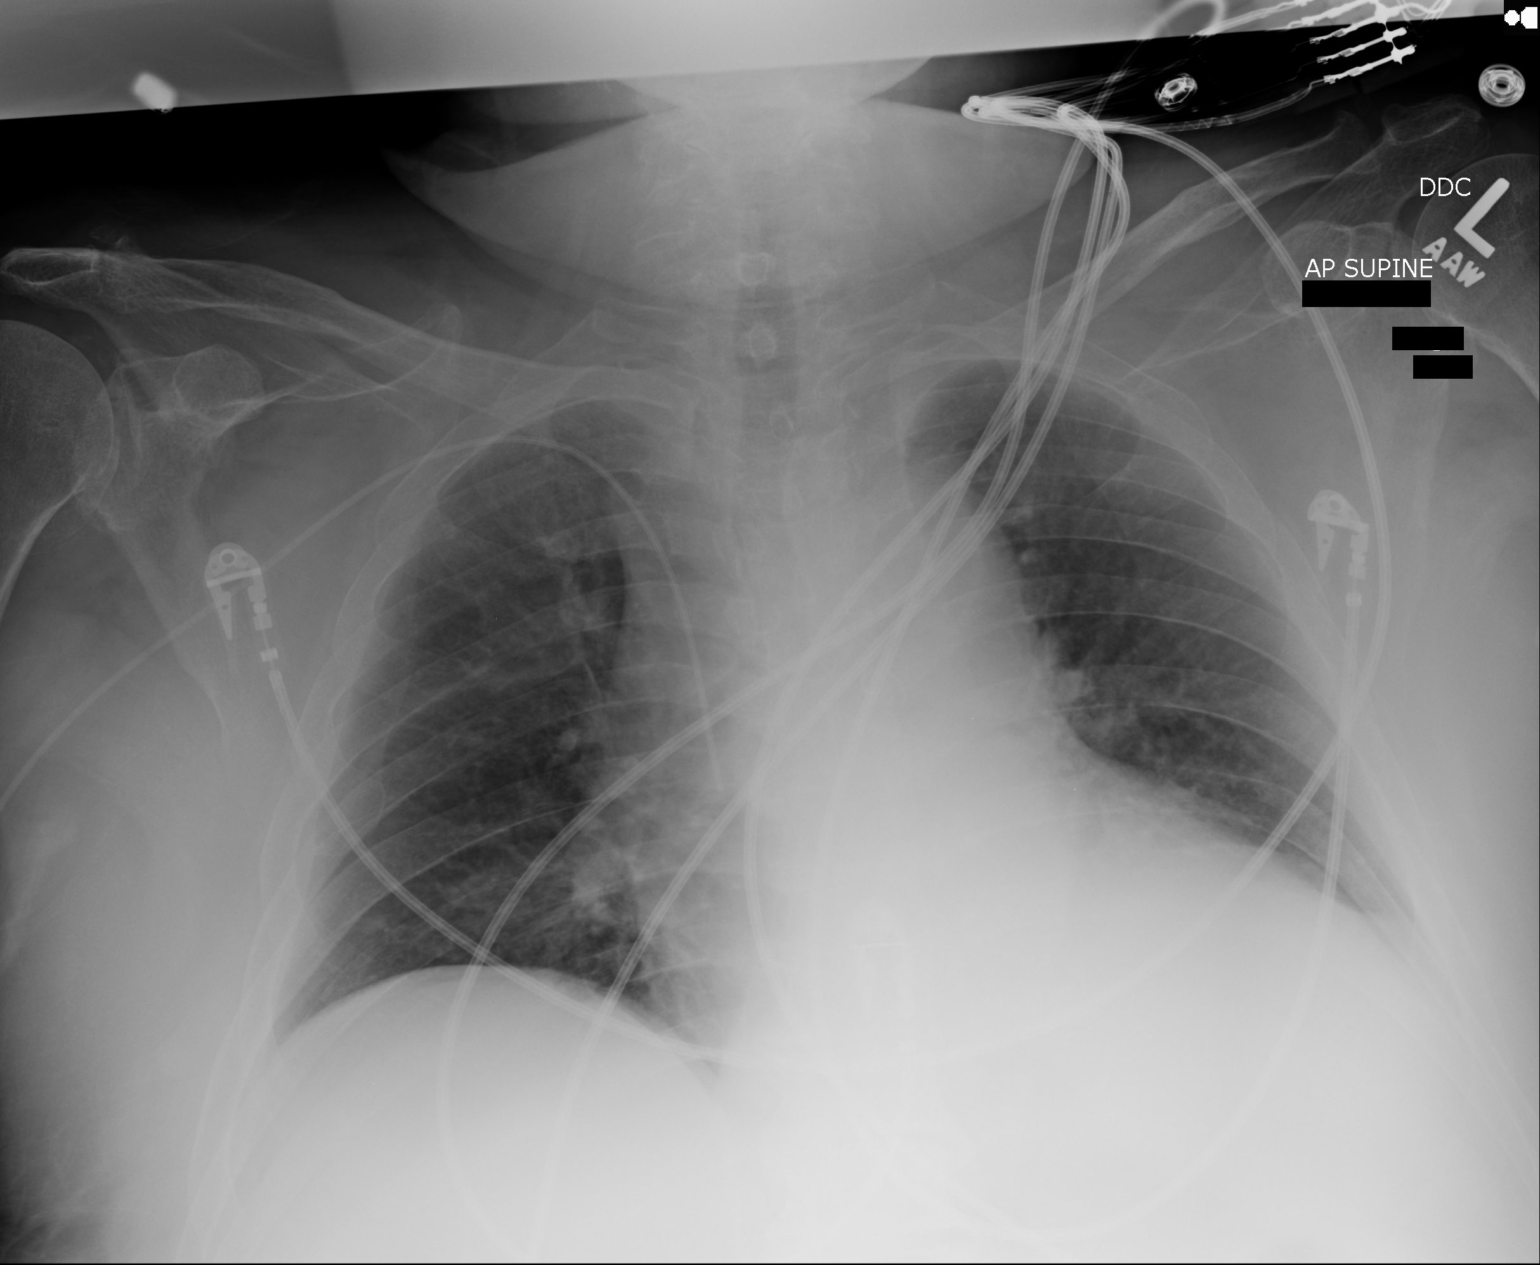

[2 of 2 positions shown; findings below may reference images not displayed]

FINDINGS: There has been interval placement of a right PICC line with tip
overlying the lower SVC. The cardiac silhouette remains enlarged.
The lungs remain hypoinflated. There is improved aeration of the
right lung base. Mild left basilar opacity remains, likely
reflecting atelectasis. No pneumothorax is identified.
IMPRESSION: Interval right PICC line placement as above.

## 2014-08-17 NOTE — Consult Note (Signed)
Brief Consult Note: Diagnosis: acute diverticulitis.   Patient was seen by consultant.   Consult note dictated.   Recommend further assessment or treatment.   Comments: acute diverticulitis with contained perforation. Rec continuing IV abx and observation. No evidence of free perforation or abscess. Will follow.  Electronic Signatures: Lattie Hawooper, Avary Eichenberger E (MD)  (Signed 05-Dec-14 14:31)  Authored: Brief Consult Note   Last Updated: 05-Dec-14 14:31 by Lattie Hawooper, Deyanna Mctier E (MD)

## 2014-08-17 NOTE — Discharge Summary (Signed)
Dates of Admission and Diagnosis:  Date of Admission 27-Mar-2013   Date of Discharge 05-Apr-2013   Admitting Diagnosis Left popliteal DVT, dehydration, hypercalcemia and acute renal failure   Final Diagnosis Left popliteal DVT, dehydration, hypercalcemia, acute renal failure/ATN, anemia, right thigh hematoma, acute diverticulitis   Discharge Diagnosis 1 Left popliteal DVT, s/p IVC filter placement   2 dehydration, hypercalcemia   3 acute renal failure/ ATN   4 anemia, right thigh hematoma   5 acute diverticulitis    Chief Complaint/History of Present Illness 81 year old gentleman found on the floor at home by family members. ED work up revealed left popliteal DVT, dehydration, hypercalcemia and acute renal failure. Patient was admitted for further management   Routine Chem:  01-Dec-14 09:19   Creatinine (comp)  2.24  02-Dec-14 04:50   Creatinine (comp)  1.64  03-Dec-14 13:01   Creatinine (comp)  1.95  04-Dec-14 04:11   Glucose, Serum  132  BUN  41  Creatinine (comp)  1.80  Sodium, Serum 139  Potassium, Serum 4.3  Chloride, Serum  109  CO2, Serum 25  Calcium (Total), Serum 10.0  Anion Gap  5  Osmolality (calc) 290  eGFR (African American)  41  eGFR (Non-African American)  35 (eGFR values <56m/min/1.73 m2 may be an indication of chronic kidney disease (CKD). Calculated eGFR is useful in patients with stable renal function. The eGFR calculation will not be reliable in acutely ill patients when serum creatinine is changing rapidly. It is not useful in  patients on dialysis. The eGFR calculation may not be applicable to patients at the low and high extremes of body sizes, pregnant women, and vegetarians.)  05-Dec-14 06:01   Creatinine (comp)  1.84  06-Dec-14 04:54   Creatinine (comp)  1.40  07-Dec-14 03:54   Creatinine (comp) 1.28  08-Dec-14 04:58   Creatinine (comp) 1.24  09-Dec-14 05:01   Glucose, Serum 96  BUN  25  Creatinine (comp) 1.24  Sodium, Serum 141   Potassium, Serum 4.2  Chloride, Serum  109  CO2, Serum 26  Calcium (Total), Serum  10.2  Anion Gap  6  Osmolality (calc) 286  eGFR (African American) >60  eGFR (Non-African American)  55 (eGFR values <638mmin/1.73 m2 may be an indication of chronic kidney disease (CKD). Calculated eGFR is useful in patients with stable renal function. The eGFR calculation will not be reliable in acutely ill patients when serum creatinine is changing rapidly. It is not useful in  patients on dialysis. The eGFR calculation may not be applicable to patients at the low and high extremes of body sizes, pregnant women, and vegetarians.)  Routine Coag:  04-Dec-14 04:11   Activated PTT (APTT)  72.8 (A HCT value >55% may artifactually increase the APTT. In one study, the increase was an average of 19%. Reference: "Effect on Routine and Special Coagulation Testing Values of Citrate Anticoagulant Adjustment in Patients with High HCT Values." American Journal of Clinical Pathology 201937;902:409-735  Routine Hem:  01-Dec-14 09:19   Hemoglobin (CBC) 14.9  02-Dec-14 04:50   Hemoglobin (CBC)  11.8  04-Dec-14 04:11   Hemoglobin (CBC)  6.1 (Result(s) reported on 30 Mar 2013 at 04:27AM.)  Platelet Count (CBC)  102 (Result(s) reported on 30 Mar 2013 at 04:27AM.)  05-Dec-14 06:01   Hemoglobin (CBC)  8.4    14:02   Hemoglobin (CBC)  8.1  06-Dec-14 04:54   Hemoglobin (CBC)  6.9  07-Dec-14 03:54   Hemoglobin (CBC)  9.5  08-Dec-14 04:58  Hemoglobin (CBC)  9.8  09-Dec-14 13:27   WBC (CBC)  14.1  RBC (CBC)  3.47  Hemoglobin (CBC)  10.7  Hematocrit (CBC)  31.4  MCV 91  MCH 31.0  MCHC 34.2  RDW 13.8  Neutrophil % 93.1  Lymphocyte % 1.7  Monocyte % 4.5  Eosinophil % 0.1  Basophil % 0.6  Neutrophil #  13.1  Lymphocyte #  0.2  Monocyte # 0.6  Eosinophil # 0.0  Basophil # 0.1 (Result(s) reported on 04 Apr 2013 at 01:48PM.)   PERTINENT RADIOLOGY STUDIES: [place]:    01-Dec-14 12:09, [place] Color Flow Doppler  Low Extrem Left (Leg)  [place] Color Flow Doppler Low Extrem Left (Leg)   REASON FOR EXAM:    swelling significantly more than right  COMMENTS:       PROCEDURE: [place]  - US DOPPLER LOW EXTR LEFT  - Mar 27 2013 12:09PM     CLINICAL DATA:  Left lower extremity swelling.    EXAM:  Left LOWER EXTREMITY VENOUS DOPPLER ULTRASOUND    TECHNIQUE:  Gray-scale sonography with graded compression, as well as color  Doppler and duplex ultrasound, were performed to evaluate the deep  venous system from the level of the common femoral vein through the  popliteal and proximal calf veins. Spectral Doppler was utilized to  evaluate flow at rest and with distal augmentation maneuvers.    COMPARISON:  None.    FINDINGS:  Thrombus within deep veins: Nonocclusive thrombus within the  proximal popliteal vein which becomes occlusive over the more distal  popliteal vein.    Compressibility of deep veins:  Noncompressible popliteal vein.    Duplex waveform respiratory phasicity: Abnormal over the popliteal  vein.    Duplex waveform response to augmentation: Abnormal over the  popliteal vein.    Venous reflux:  None visualized.    Other findings:  Remainder of the venous system is unremarkable.     IMPRESSION:  Evidence of acute thrombus within the popliteal vein which is  partially occlusive proximally and occlusive over the distal aspect  of the popliteal vein.    Technologist called the report at time of dictation.      Electronically Signed    By: Marin Olp M.D.    On: 03/27/2013 12:35         Verified By: Pearletha Alfred, M.D.,    01-Dec-14 14:27, US Kidney Bilateral  US Kidney Bilateral   REASON FOR EXAM:    AKI  COMMENTS:       PROCEDURE: [place]  - US KIDNEY  - Mar 27 2013  2:27PM     CLINICAL DATA:  Acute renal insufficiency.    EXAM:  RENAL/URINARY TRACT ULTRASOUND COMPLETE    COMPARISON:  None.    FINDINGS:  Right Kidney:  Length: 11.0 cm. Echogenicity within normal limits.  No mass or  hydronephrosis visualized. Multiple nonobstructing stones with the  largest measuring approximately 1 cm over the mid pole.    Left Kidney:    Length: 10.9 cm. Echogenicity within normal limits. No  hydronephrosis visualized. 1 cm cyst over the mid pole. Multiple  nonobstructing stones with the largest over the mid pole measuring  approximately 1 cm.    Bladder:    Appears normal for degree of bladder distention.   IMPRESSION:  Multiple bilateral nonobstructing renal stones.    1 cm left renal cyst.      Electronically Signed    By: Marin Olp M.D.  On: 03/27/2013 14:48         Verified By: Pearletha Alfred, M.D.,  CT:    01-Dec-14 10:08, CT Head Without Contrast  CT Head Without Contrast   REASON FOR EXAM:    felk hit  head  COMMENTS:       PROCEDURE: CT  - CT HEAD WITHOUT CONTRAST  - Mar 27 2013 10:08AM     CLINICAL DATA:  [place] and hit head    EXAM:  CT HEAD WITHOUT CONTRAST    TECHNIQUE:  Contiguous axial images were obtained from the base of the skull  through the vertex without intravenous contrast.    COMPARISON:  CT 12/27/2012  FINDINGS:  Moderate atrophy. Negative for hydrocephalus. 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 No acute infarct. Negative for  hemorrhage or mass. No midline shift. Negative for skull fracture.     IMPRESSION:  No acute abnormality and no change from the prior study.      Electronically Signed    By: Franchot Gallo M.D.    On: 03/27/2013 10:12         Verified By: Truett Perna, M.D.,    01-Dec-14 10:10, CT Cervical Spine Without Contrast  CT Cervical Spine Without Contrast   REASON FOR EXAM:    fell, h it head  COMMENTS:       PROCEDURE: CT  - CT CERVICAL SPINE WO  - Mar 27 2013 10:10AM     CLINICAL DATA:  Fall.    EXAM:  CT CERVICAL SPINE WITHOUT CONTRAST    TECHNIQUE:  Multidetector CT imaging of the cervical spine wasperformed without  intravenous contrast. Multiplanar CT  image reconstructions were also  generated.  COMPARISON:  C-spine MRI 09/03/2011    FINDINGS:  Vertebral body alignment and heights are within normal. There is  moderate spondylosis throughout the cervical spine. There is disc  space narrowing from the C3-4 level through the C7-T1 level.  Prevertebral soft tissues as well as a atlantoaxial articulation are  within normal. There is moderate uncovertebral joint spurring and  facet arthropathy.There is moderate bilateral neural foraminal  narrowing at multiple levels. There is no acute fracture or  subluxation. There is a suggestion of minimal canal stenosis at the  C4-5 and C5-6 levels unchanged. Remainder the exam is unremarkable.     IMPRESSION:  No acute cervical spine injury.  Moderate spondylosis with multilevel degenerative disc disease and  significant bilateral multilevel neural foraminal narrowing.  Possible mild canal stenosis at C4-5 and C5-6 levels unchanged.      Electronically Signed    By: Marin Olp M.D.    On: 03/27/2013 10:21         Verified By: Pearletha Alfred, M.D.,    04-Dec-14 13:50, CT Femur/Thigh Right Without Contrast  CT Femur/Thigh Right Without Contrast   REASON FOR EXAM:    Right thigh swollen  and hard  COMMENTS:       PROCEDURE: CT  - CT FEMUR [place] RIGHT WO  - Mar 30 2013  1:50PM     CLINICAL DATA:  Right thigh swelling and firmness.  History of DVT.    EXAM:  CT OF THE LOWER RIGHT EXTREMITY WITHOUT CONTRAST    TECHNIQUE:  Multidetector CT imaging of the right thigh was performed according  to the standard protocol. Multiplanar CT image reconstructions were  also generated.  COMPARISON:  None.    FINDINGS:  There is no  evidence of acute fracture, dislocation or bone  destruction. At the hip, there is mild joint space loss. The femoral  head appears normal. At the knee, there are tricompartmental  degenerative changes with meniscal chondrocalcinosis and multiple  peripherally  calcified loose bodies in a small Baker's cyst. There  is no significant knee joint effusion.    Subcutaneous edema is noted throughout the anterolateral thigh,  greatest proximally. In the distal thigh, there is ill-defined fluid  anterior to the patella and lateral patellar retinaculum. There is  heterogeneous enlargement of the quadriceps musculature with  ill-defined low density in the vastus lateralis, measuring  approximately 4.4 x 2.1 cm transverse on axial images 165 of series  606. There are no apparent soft tissue calcifications in this area.    Diffuse vascular calcifications are noted. Vascular assessment is  limited without contrast.     IMPRESSION:  1. There is nonspecific enlargement of the quadriceps musculature  with ill-defined low-density in the vastus lateralis muscle in the  mid thigh. These findings could be secondary to myositis with early  abscess formation or prior muscular injury with hematoma. A mass  cannot be excluded by this examination. Clinical correlation and  follow-uprecommended. If more definitive characterization is  warranted, MRI without and with contrast could be performed.  2. Subcutaneous edema anterior laterally, nonspecific, but possibly  cellulitis or sequela of prior DVT.  3. Suspected prepatellar bursitis.  4. No acute osseous findings. There are degenerative changes at the  knee with multiple loose bodies in a Baker's cyst.      Electronically Signed    By: Camie Patience M.D.    On: 03/30/2013 14:38         Verified By: Vivia Ewing, M.D.,    05-Dec-14 10:10, CT Abdomen and Pelvis Without Contrast  CT Abdomen and Pelvis Without Contrast   REASON FOR EXAM:    (1) Drop in Hb to 6.1, possible retroperitoneal   bleed; (2) Drop in Hb to 6.1, po  COMMENTS:       PROCEDURE: CT  - CT ABDOMEN AND PELVIS W0  - Mar 31 2013 10:10AM     CLINICAL DATA:  Kidney failure. The dropping hemoglobin. Evaluate  for potential retroperitoneal  hemorrhage.    EXAM:  CT ABDOMEN AND PELVIS WITHOUT CONTRAST    TECHNIQUE:  Multidetector CT imaging of the abdomen and pelvis was performed  following the standard protocol without intravenous contrast.  COMPARISON:  No priors.    FINDINGS:  Lung Bases: Heart size is normal. Lipomatous hypertrophy of the  interatrial septum. Small amount of posterior pericardial fluid  and/or thickening, unlikely to be of any hemodynamic significance at  this time. No associated pericardial calcification. Severe  calcifications of the aortic valve. Mild calcifications of the  inferior aspect of the mitral annulus. Small bilateral pleural  effusions layering dependently. Bibasilar dependent opacities are  favored to represent mild subsegmental atelectasis.    Abdomen/Pelvis: No abnormal intraperitoneal or retroperitoneal high  attenuation fluid collections to suggest hemoperitoneum or  retroperitoneal hematoma.  Numerous calcifications in the collecting systems of the kidneys  bilaterally, compatible with nonobstructive calculi, largest of  which measures 12 mm in the lower pole collecting system of the  right kidney. No ureteral stones or hydroureteronephrosis to suggest  urinary tract obstruction at this time. Bilateral perinephric  stranding is noted, but is nonspecific and is commonly seen in the  setting of renal insufficiency. No bladder calculi. No definite  focal renal lesions are appreciated on today's non noncontrast CT  examination.    The unenhanced appearance of the liver, gallbladder, pancreas,  spleen and bilateral adrenal glands is unremarkable. No significant  volume of ascites. No pathologic distention of small bowel. No  definite lymphadenopathy identified within the abdomen or pelvis on  today's non contrast CT examination. Extensive atherosclerosis of  the abdominal and pelvic vasculature, without definite aneurysm.  Prostate gland is unremarkable in  appearance.    Numerous colonic diverticulae are noted, and adjacent to some  diverticulae in the proximal sigmoid colon best demonstrated on  images 73-75 of series 2 and coronal reconstructions 78-81 of series  5, there are some surrounding inflammatory changes, compatible with  acute diverticulitis. More superiorly, presumably within the sigmoid  colon, best demonstrated on image 67 of series 2 there are some  locules of extra luminal gas. No other evidence of frank  pneumoperitoneum is seen at this time.    Musculoskeletal: There is asymmetric enlargement of the right thigh  musculature and poor definition of the intramuscular fat planes in  the visualized portions of the upper right thigh. No well-defined  hematoma is identified in the visualized portions of the thigh,  however, the possibility of diffusely infiltrative hemorrhage in the  deep tissues of the thigh is suspected. There are no aggressive  appearing lytic or blastic lesions noted in the visualized portions  of the skeleton.     IMPRESSION:  1. Asymmetric enlargement of the right thigh with loss of normal fat  planes between the musculature of the thigh which may be concerning  for a diffusely infiltrative hemorrhage. This thigh is incompletely  visualized on today's study. Clinical correlation for signs and  symptoms of potential spontaneous hemorrhage in the right thigh  musculature is recommended.  2. Importantly, this patient does appear to have acute  diverticulitis in the proximal sigmoid colon, with a contained  perforation as demonstrated by a small amount of extraluminal gas  within the sigmoid mesocolon. No well-defined diverticular abscess  is identified. No frank pneumoperitoneum otherwise noted.  3. No hemoperitoneum or retroperitoneal hematoma.  4. Numerous nonobstructive calculi within the collecting systems of  the kidneys bilaterally, largest of which measures 12 mm in the  lower pole.  5. Small  bilateral pleural effusions with dependent subsegmental  atelectasis in the lower lobes of the lungs bilaterally.  6. There are calcifications of the aortic valve. Echocardiographic  correlation for evaluation of potential valvular dysfunction may be  warranted if clinically indicated.  7. Lipomatous hypertrophy of the interatrial septum incidentally  noted.  These results were called by telephone at the time of interpretation  on 03/31/2013 at 1:11 PM to Dr. Glendon Axe , who verbally  acknowledged these results.      Electronically Signed    By: Vinnie Langton M.D.    On: 03/31/2013 13:12         Verified By: Etheleen Mayhew, M.D.,  Nuclear Med:    04-Dec-14 13:00, GI Blood Loss Study - Nuc Med  GI Blood Loss Study - Nuc Med   REASON FOR EXAM:    HBG 5.4  COMMENTS:       PROCEDURE: NM  - NM GI BLOOD LOSS STUDY  - Mar 30 2013  1:00PM     CLINICAL DATA:  Gastrointestinal bleeding, low hemoglobin    EXAM:  NUCLEAR MEDICINE GASTROINTESTINAL BLEEDING SCAN    TECHNIQUE:  Sequential abdominal images were obtained following  intravenous  administration of Tc-79mlabeled red blood cells.    COMPARISON:  None.  RADIOPHARMACEUTICALS:  22 mCi Tc-920mn-vivo labeled red cells.    FINDINGS:  There is no accumulation of radiotracer within the abdomen or pelvis  to suggest active endoluminal bleeding within the GI tract. Blood  pool activity is noted as well as activity within the solid organs.     IMPRESSION:  No scintigraphic evidence of active gastrointestinal bleeding on 1  hr exam.      Electronically Signed    By: StSuzy Bouchard.D.    On: 03/30/2013 14:22     Verified By: JORennis GoldenM.D.,   Hospital Course:  Hospital Course Patient was hydrated with i.v fluids and started on i.v heparin for left popliteal DVT. Creatinine improved gradually and serum calcium returned to normal. Patient had a sudden drop in hemoglobin to 6.1 with right thigh swelling  during the hospital stay. Work up confirmed right thigh hematoma on CT. Heparin and aspirin were held. Patient received transfusion with PRBCs and was transferred to CCU for monitoring over the weekend. IVC filter was placed by Vascular surgery on Friday. Patient also c/o abdominal pain. CT abdomen and pelvis revealed acute diverticulitis. He was kept NPO and started on i.v Levofloxacin and Metronidazole. Patient's condition improved over the weekend. Abdominal pain resolved. He was transferred to the floor. Patient tolerated clear liquid diet, which was gradually advanced to full liquids and regular diet. No further bleeding. H/H remained stable.   Condition on Discharge Stable   DISCHARGE INSTRUCTIONS HOME MEDS:  Medication Reconciliation: Patient's Home Medications at Discharge:     Medication Instructions  finasteride 5 mg oral tablet  1 tab(s) orally once a day   tamsulosin 0.4 mg oral capsule  1 cap(s) orally once a day   ramipril 10 mg oral capsule  2 cap(s) orally once a day   vitamin d3 5000 intl units oral capsule  1 cap(s) orally once a week   lipitor 40 mg oral tablet  1 tab(s) orally once a day (at bedtime)   toprol-xl 25 mg oral tablet, extended release  1 tab(s) orally once a day   citalopram 10 mg oral tablet  1 tab(s) orally once a day   potassium chloride 10 meq oral capsule, extended release  1 cap(s) orally once a day   prednisone 20 mg oral tablet  3 tab(s) orally once a day   aspirin 81 mg oral tablet  1 tab(s) orally once a day   lasix 40 mg oral tablet  orally 2 times a day   vitamin b12 1000 mcg oral tablet  1 tab(s) orally once a day   metronidazole 500 mg oral tablet  1 tab(s) orally every 8 hours   acetaminophen 325 mg oral tablet  2 tab(s) orally every 4 hours, As needed, pain or temp. greater than 100.4   docusate sodium 100 mg oral capsule  1 cap(s) orally 2 times a day, As needed, constipation   pantoprazole 40 mg oral delayed release tablet  1 tab(s) orally 2  times a day   levofloxacin 750 mg oral tablet  1 tab(s) orally once a day   folic acid 1 mg oral tablet  1 tab(s) orally once a day   ensure *  240 milliliter(s) orally 3 times a day (with meals)    PRESCRIPTIONS: PRINTED AND PLACED ON CHART   Physician's Instructions:  Diet Low Sodium   Dietary Supplements Ensure  Activity Limitations As tolerated   Return to Work Not Applicable   Time frame for Follow Up Appointment 1-2 weeks  Dr. Glendon Axe   Time frame for Follow Up Appointment 1-2 weeks  [facility] cardiology   Other Comments Transfer to facility   Electronic Signatures: Glendon Axe (MD)  (Signed 10-Dec-14 15:16)  Authored: ADMISSION DATE AND DIAGNOSIS, CHIEF COMPLAINT/HPI, PERTINENT LABS, [place], PATIENT INSTRUCTIONS   Last Updated: 10-Dec-14 15:16 by Glendon Axe (MD)

## 2014-08-17 NOTE — Consult Note (Signed)
Chief Complaint:  Subjective/Chief Complaint Pt resting quietly today. Denies cp no sob. S/P fall with elevcated troponins.   VITAL SIGNS/ANCILLARY NOTES: **Vital Signs.:   02-Dec-14 05:31  Vital Signs Type Routine  Temperature Temperature (F) 98.6  Celsius 37  Pulse Pulse 77  Respirations Respirations 18  Systolic BP Systolic BP 419  Diastolic BP (mmHg) Diastolic BP (mmHg) 72  Mean BP 84  Pulse Ox % Pulse Ox % 96  Pulse Ox Activity Level  At rest  Oxygen Delivery 2L  *Intake and Output.:   Daily 02-Dec-14 07:00  Grand Totals Intake:  688 Output:  680    Net:  8 24 Hr.:  8  IV (Primary)      In:  500  IV (Secondary)      In:  188  Urine ml     Out:  680  Length of Stay Totals Intake:  688 Output:  680    Net:  8   Brief Assessment:  GEN well developed, well nourished, no acute distress, obese, blind   Cardiac Irregular  murmur present  + LE edema  --Gallop   Respiratory normal resp effort  clear BS   Gastrointestinal Normal   Gastrointestinal details normal Soft   EXTR negative cyanosis/clubbing, positive edema   Lab Results: LabObservation:  01-Dec-14 12:09   OBSERVATION Reason for Test swelling significantly more than right    18:08   OBSERVATION Reason for Test  Hepatic:  01-Dec-14 09:19   Bilirubin, Total 0.9  Alkaline Phosphatase 72 (45-117 NOTE: New Reference Range 03/17/13)  SGPT (ALT) 46  SGOT (AST)  48  Total Protein, Serum  6.0  Albumin, Serum  3.0  Routine Micro:  01-Dec-14 12:32   Micro Text Report BLOOD CULTURE   COMMENT                   NO GROWTH IN 18-24 HOURS   ANTIBIOTIC                       Micro Text Report BLOOD CULTURE   COMMENT                   NO GROWTH IN 18-24 HOURS   ANTIBIOTIC                       Culture Comment NO GROWTH IN 18-24 HOURS  Result(s) reported on 28 Mar 2013 at 12:00PM.  Culture Comment NO GROWTH IN 18-24 HOURS  Result(s) reported on 28 Mar 2013 at 12:00PM.  Cardiology:  01-Dec-14 09:14    Ventricular Rate 97  Atrial Rate 416  QRS Duration 72  QT 336  QTc 426  R Axis 12  T Axis 41  ECG interpretation *** Poor data quality, interpretation may be adversely affected Atrial fibrillation Inferior infarct (cited on or before 03-Sep-2011) Abnormal ECG When compared with ECG of 03-Sep-2011 01:18, Atrial fibrillation has replaced Sinus rhythm Right bundle branch block is no longer Present ----------unconfirmed---------- Confirmed by OVERREAD, NOT ([id number]), editor PEARSON, BARBARA ([id number]) on 03/28/2013 1:29:10 PM    18:08   Echo Doppler REASON FOR EXAM:     COMMENTS:     PROCEDURE: [place] - ECHO DOPPLER COMPLETE(TRANSTHOR)  - Mar 27 2013  6:08PM   RESULT: Echocardiogram Report  Patient Name:   Ian Avery Date of Exam: 03/27/2013 Medical Rec #:  [id number]  Custom1: Date of Birth:  [birth year]           Height:       71.0 in Patient Age:    81 years           Weight:       221.0 lb Patient Gender: M                  BSA:          2.20 m??  Indications: Atrial Fib Sonographer:    Arville Go RDCS Referring Phys: [facility], JASMINE  Sonographer Comments: Suboptimal apical window and suboptimal subcostal  window.  Summary:  1. Left ventricular ejection fraction, by visual estimation, is 70 to  75%.  2. Normal global left ventricular systolic function.  3. Mildly dilated left atrium.  4. Mildly dilated right atrium.  5. Mild mitral valve regurgitation.  6. Mild thickening of the anterior and posterior mitral valve leaflets.  7. Mild aortic valve stenosis.  8. Mildly elevated pulmonary artery systolic pressure.  9. Moderately increased left ventricular posterior wall thickness. 10. Mild tricuspid regurgitation. 2D AND M-MODE MEASUREMENTS (normal ranges within parentheses): Left Ventricle:          Normal IVSd (2D):      1.63 cm (0.7-1.1) LVPWd (2D):     1.42 cm (0.7-1.1) Aorta/LA:                  Normal LVIDd (2D):     4.00 cm (3.4-5.7) Aortic Root (2D): 3.50  cm (2.4-3.7) LVIDs (2D):     2.39 cm           Left Atrium (2D): 4.90 cm (1.9-4.0) LV FS (2D):     40.3 %   (>25%) LV EF (2D):     71.5 %   (>50%)                                   Right Ventricle:                                   RVd (2D): SPECTRAL DOPPLER ANALYSIS (where applicable): Mitral Valve: MV Max Vel:   1.14 m/s MV P1/2 Time: 79.00 msec MV Mean Grad: 2.0 mmHg MV Area, PHT: 2.78 cm?? Aortic Valve: AoV Max Vel: 2.49 m/s AoV Peak PG: 24.9 mmHg AoV Mean PG:  13.0 mmHg LVOT Vmax: 1.52 m/s LVOT VTI: 0.219 m LVOT Diameter: 1.80 cm AoV Area, Vmax: 1.55 cm?? AoV Area, VTI: 1.48 cm?? AoV Area, Vmn: 1.46 cm?? Tricuspid Valve and PA/RV Systolic Pressure: TR Max Velocity: 2.90 m/s RA  Pressure: 10 mmHg RVSP/PASP: 43.6 mmHg Pulmonic Valve: PV Max Velocity: 1.49 m/s PV Max PG: 8.9 mmHg PV Mean PG:  PHYSICIAN INTERPRETATION: Left Ventricle: The left ventricular internal cavity size was normal. LV  septal wall thickness was normal. LV posterior wall thickness was  moderately increased. Global LV systolic function was normal. Left  ventricular ejection fraction, by visual estimation, is 70 to 75%. Right Ventricle: The right ventricular size is mildly enlarged. Global RV  systolic function is normal. Left Atrium: The left atrium is mildly dilated. Right Atrium: The right atrium is mildly dilated. Pericardium: There is no evidence of pericardial effusion. Mitral Valve: The mitral valve is normal in structure. There is mild  thickening of the anterior and posterior mitral valve leaflets.  Mild  mitral valve regurgitation is seen. Tricuspid Valve: The tricuspid valve is normal. Mild tricuspid  regurgitation is visualized. The tricuspid regurgitant velocity is 2.90  m/s, and with an assumed right atrial pressure of 10 mmHg, the estimated  right ventricular systolic pressure is mildly elevated at 43.6 mmHg. Aortic Valve: The aortic valve is normal. Mild aortic stenosis is present. Pulmonic  Valve: The pulmonic valve is normal. [place] MD Electronically signed by [place] MD Signature Date/Time: 03/28/2013/1:00:54 PM  *** Final ***  IMPRESSION: .    Verified By: Yolonda Kida, M.D., MD  Routine Chem:  01-Dec-14 09:19   Glucose, Serum 77  BUN  53  Creatinine (comp)  2.24  Sodium, Serum 138  Potassium, Serum 4.8  Chloride, Serum 104  CO2, Serum 28  Calcium (Total), Serum  11.8  Anion Gap  6  Osmolality (calc) 289  eGFR (African American)  31  eGFR (Non-African American)  27 (eGFR values <1m/min/1.73 m2 may be an indication of chronic kidney disease (CKD). Calculated eGFR is useful in patients with stable renal function. The eGFR calculation will not be reliable in acutely ill patients when serum creatinine is changing rapidly. It is not useful in  patients on dialysis. The eGFR calculation may not be applicable to patients at the low and high extremes of body sizes, pregnant women, and vegetarians.)  Result Comment TOTAL CK - Slight hemolysis, interpret results with  - caution.  Result(s) reported on 27 Mar 2013 at 01:06PM.  Result Comment TROPONIN - RESULTS VERIFIED BY REPEAT TESTING.  - R/CALLED TO JEAN ADAMS 03/27/13-1039  - READ-BACK PROCESS PERFORMED.  Result(s) reported on 27 Mar 2013 at 10:18AM.  B-Type Natriuretic Peptide ([facility]  2002 (Result(s) reported on 27 Mar 2013 at 12:57PM.)    17:42   Result Comment TROPONIN - RESULTS VERIFIED BY REPEAT TESTING.  - ELEVATED TROPONIN PREVIOUSLY CALLED ON  - 03-27-13 AT 1039.VKB  Result(s) reported on 27 Mar 2013 at 06:41PM.    20:19   Result Comment APTT - NOTIFIED OF CRITICAL VALUE  - RESULTS VERIFIED BY REPEAT TESTING.  - CALLED TO DANA NEEDHAM AT 2143 ON  - 03/27/13..Marland KitchenMarland KitchenPL  - READ-BACK PROCESS PERFORMED.  Result(s) reported on 27 Mar 2013 at 09:44PM.  Result Comment TROPONIN - RESULTS VERIFIED BY REPEAT TESTING.  - ELEVATED TROPONIN PREVIOUSLY CALLED ON  - 03-27-13 AT  1039.VKB  Result(s) reported on 27 Mar 2013 at 09:48PM.  Cardiac:  01-Dec-14 09:19   Troponin I  0.29 (0.00-0.05 0.05 ng/mL or less: NEGATIVE  Repeat testing in 3-6 hrs  if clinically indicated. >0.05 ng/mL: POTENTIAL  MYOCARDIAL INJURY. Repeat  testing in 3-6 hrs if  clinically indicated. NOTE: An increase or decrease  of 30% or more on serial  testing suggests a  clinically important change)  CK, Total 205    17:42   Troponin I  0.25 (0.00-0.05 0.05 ng/mL or less: NEGATIVE  Repeat testing in 3-6 hrs  if clinically indicated. >0.05 ng/mL: POTENTIAL  MYOCARDIAL INJURY. Repeat  testing in 3-6 hrs if  clinically indicated. NOTE: An increase or decrease  of 30% or more on serial  testing suggests a  clinically important change)    20:19   Troponin I  0.20 (0.00-0.05 0.05 ng/mL or less: NEGATIVE  Repeat testing in 3-6 hrs  if clinically indicated. >0.05 ng/mL: POTENTIAL  MYOCARDIAL INJURY. Repeat  testing in 3-6 hrs if  clinically indicated. NOTE: An increase or decrease  of 30%  or more on serial  testing suggests a  clinically important change)  Routine UA:  01-Dec-14 11:09   Color (UA) Yellow  Clarity (UA) Hazy  Glucose (UA) Negative  Bilirubin (UA) Negative  Ketones (UA) Negative  Specific Gravity (UA) 1.012  Blood (UA) 2+  pH (UA) 5.0  Protein (UA) 100 mg/dL  Nitrite (UA) Negative  Leukocyte Esterase (UA) Trace (Result(s) reported on 27 Mar 2013 at 11:28AM.)  RBC (UA) 64 /HPF  WBC (UA) 16 /HPF  Bacteria (UA) TRACE  Epithelial Cells (UA) <1 /HPF  Hyaline Cast (UA) 18 /LPF  Amorphous Crystal (UA) PRESENT (Result(s) reported on 27 Mar 2013 at 11:28AM.)  Routine Coag:  01-Dec-14 20:19   Activated PTT (APTT)  > 160.0 (A HCT value >55% may artifactually increase the APTT. In one study, the increase was an average of 19%. Reference: "Effect on Routine and Special Coagulation Testing Values of Citrate Anticoagulant Adjustment in Patients with High HCT  Values." American Journal of Clinical Pathology 2006;126:400-405.)  Routine Hem:  01-Dec-14 09:19   WBC (CBC)  26.8  RBC (CBC) 4.87  Hemoglobin (CBC) 14.9  Hematocrit (CBC) 44.5  Platelet Count (CBC)  91  MCV 91  MCH 30.7  MCHC 33.6  RDW 13.7  Neutrophil % 89.7  Lymphocyte % 2.5  Monocyte % 7.6  Eosinophil % 0.0  Basophil % 0.2  Neutrophil #  24.1  Lymphocyte #  0.7  Monocyte #  2.0  Eosinophil # 0.0  Basophil # 0.1 (Result(s) reported on 27 Mar 2013 at 10:12AM.)    17:42   Platelet Count (CBC)  80 (Result(s) reported on 27 Mar 2013 at [facility].)   Radiology Results: XRay:    01-Dec-14 10:34, Chest PA and Lateral  Chest PA and Lateral   REASON FOR EXAM:    weak  COMMENTS:       PROCEDURE: DXR - DXR CHEST PA (OR AP) AND LATERAL  - Mar 27 2013 10:34AM     CLINICAL DATA:  Weakness and fall.    EXAM:  CHEST - 2 VIEW    COMPARISON:  None    FINDINGS:  There is no evidence of pulmonary edema, consolidation,  pneumothorax, nodule or pleural fluid. The heart is enlarged. The  aorta is mildly ectatic. The bony thorax shows degenerative changes  of the thoracic spine without visible fracture or bony lesion.     IMPRESSION:  No active disease.  Cardiomegaly without pulmonary edema.      Electronically Signed    By: Aletta Edouard M.D.    On: 03/27/2013 10:48         Verified By: Azzie Roup, M.D.,  [place]:    01-Dec-14 12:09, [place] Color Flow Doppler Low Extrem Left (Leg)  [place] Color Flow Doppler Low Extrem Left (Leg)   REASON FOR EXAM:    swelling significantly more than right  COMMENTS:       PROCEDURE: [place]  - US DOPPLER LOW EXTR LEFT  - Mar 27 2013 12:09PM     CLINICAL DATA:  Left lower extremity swelling.    EXAM:  Left LOWER EXTREMITY VENOUS DOPPLER ULTRASOUND    TECHNIQUE:  Gray-scale sonography with graded compression, as well as color  Doppler and duplex ultrasound, were performed to evaluate the deep  venous system from the level of the common  femoral vein through the  popliteal and proximal calf veins. Spectral Doppler was utilized to  evaluate flow at rest and with distal augmentation maneuvers.  COMPARISON:  None.    FINDINGS:  Thrombus within deep veins: Nonocclusive thrombus within the  proximal popliteal vein which becomes occlusive over the more distal  popliteal vein.    Compressibility of deep veins:  Noncompressible popliteal vein.    Duplex waveform respiratory phasicity: Abnormal over the popliteal  vein.    Duplex waveform response to augmentation: Abnormal over the  popliteal vein.    Venous reflux:  None visualized.    Other findings:  Remainder of the venous system is unremarkable.     IMPRESSION:  Evidence of acute thrombus within the popliteal vein which is  partially occlusive proximally and occlusive over the distal aspect  of the popliteal vein.    Technologist called the report at time of dictation.      Electronically Signed    By: Marin Olp M.D.    On: 03/27/2013 12:35         Verified By: Pearletha Alfred, M.D.,    01-Dec-14 14:27, US Kidney Bilateral  US Kidney Bilateral   REASON FOR EXAM:    AKI  COMMENTS:       PROCEDURE: [place]  - US KIDNEY  - Mar 27 2013  2:27PM     CLINICAL DATA:  Acute renal insufficiency.    EXAM:  RENAL/URINARY TRACT ULTRASOUND COMPLETE    COMPARISON:  None.    FINDINGS:  Right Kidney:  Length: 11.0 cm. Echogenicity within normal limits. No mass or  hydronephrosis visualized. Multiple nonobstructing stones with the  largest measuring approximately 1 cm over the mid pole.    Left Kidney:    Length: 10.9 cm. Echogenicity within normal limits. No  hydronephrosis visualized. 1 cm cyst over the mid pole. Multiple  nonobstructing stones with the largest over the mid pole measuring  approximately 1 cm.    Bladder:    Appears normal for degree of bladder distention.   IMPRESSION:  Multiple bilateral nonobstructing renal stones.    1 cm left  renal cyst.      Electronically Signed    By: Marin Olp M.D.    On: 03/27/2013 14:48         Verified By: Pearletha Alfred, M.D.,  Cardiology:    01-Dec-14 09:14, ED ECG  Ventricular Rate 97  Atrial Rate 416  QRS Duration 72  QT 336  QTc 426  R Axis 12  T Axis 41  ECG interpretation   *** Poor data quality, interpretation may be adversely affected  Atrial fibrillation  Inferior infarct (cited on or before 03-Sep-2011)  Abnormal ECG  When compared with ECG of 03-Sep-2011 01:18,  Atrial fibrillation has replaced Sinus rhythm  Right bundle branch block is no longer Present  ----------unconfirmed----------  Confirmed by OVERREAD, NOT ([id number]), editor PEARSON, BARBARA ([id number]) on 03/28/2013 1:29:10 PM  ED ECG     01-Dec-14 18:08, Echo Doppler  Echo Doppler   REASON FOR EXAM:      COMMENTS:       PROCEDURE: [place] - ECHO DOPPLER COMPLETE(TRANSTHOR)  - Mar 27 2013  6:08PM     RESULT: Echocardiogram Report    Patient Name:   Ian Avery Date of Exam: 03/27/2013  Medical Rec #:  [id number]             Custom1:  Date of Birth:  [birth year]           Height:       71.0 in  Patient Age:    39 years  Weight:       221.0 lb  Patient Gender: M                  BSA:          2.20 m??    Indications: Atrial Fib  Sonographer:    Arville Go RDCS  Referring Phys: [facility], JASMINE    Sonographer Comments: Suboptimal apical window and suboptimal subcostal   window.    Summary:   1. Left ventricular ejection fraction, by visual estimation, is 70 to   75%.   2. Normal global left ventricular systolic function.   3. Mildly dilated left atrium.   4. Mildly dilated right atrium.   5. Mild mitral valve regurgitation.   6. Mild thickening of the anterior and posterior mitral valve leaflets.   7. Mild aortic valve stenosis.   8. Mildly elevated pulmonary artery systolic pressure.   9. Moderately increased left ventricular posterior wall thickness.  10. Mild tricuspid  regurgitation.  2D AND M-MODE MEASUREMENTS (normal ranges within parentheses):  Left Ventricle:          Normal  IVSd (2D):      1.63 cm (0.7-1.1)  LVPWd (2D):     1.42 cm (0.7-1.1) Aorta/LA:                  Normal  LVIDd (2D):     4.00 cm (3.4-5.7) Aortic Root (2D): 3.50 cm (2.4-3.7)  LVIDs (2D):     2.39 cm           Left Atrium (2D): 4.90 cm (1.9-4.0)  LV FS (2D):     40.3 %   (>25%)  LV EF (2D):     71.5 %   (>50%)                                    Right Ventricle:                                    RVd (2D):  SPECTRAL DOPPLER ANALYSIS (where applicable):  Mitral Valve:  MV Max Vel:   1.14 m/s MV P1/2 Time: 79.00 msec  MV Mean Grad: 2.0 mmHg MV Area, PHT: 2.78 cm??  Aortic Valve: AoV Max Vel: 2.49 m/s AoV Peak PG: 24.9 mmHg AoV Mean PG:   13.0 mmHg  LVOT Vmax: 1.52 m/s LVOT VTI: 0.219 m LVOT Diameter: 1.80 cm  AoV Area, Vmax: 1.55 cm?? AoV Area, VTI: 1.48 cm?? AoV Area, Vmn: 1.46 cm??  Tricuspid Valve and PA/RV Systolic Pressure: TR Max Velocity: 2.90 m/s RA   Pressure: 10 mmHg RVSP/PASP: 43.6 mmHg  Pulmonic Valve:  PV Max Velocity: 1.49 m/s PV Max PG: 8.9 mmHg PV Mean PG:    PHYSICIAN INTERPRETATION:  Left Ventricle: The left ventricular internal cavity size was normal. LV   septal wall thickness was normal. LV posterior wall thickness was   moderately increased. Global LV systolic function was normal. Left   ventricular ejection fraction, by visual estimation, is 70 to 75%.  Right Ventricle: The right ventricular size is mildly enlarged. Global RV   systolic function is normal.  Left Atrium: The left atrium is mildly dilated.  Right Atrium: The right atrium is mildly dilated.  Pericardium: There is no evidence of pericardial effusion.  Mitral Valve: The mitral valve is normal in structure.  There is mild   thickening of the anterior and posterior mitral valve leaflets. Mild   mitral valve regurgitation is seen.  Tricuspid Valve: The tricuspid valve is normal. Mild  tricuspid   regurgitation is visualized. The tricuspid regurgitant velocity is 2.90   m/s, and with an assumed right atrial pressure of 10 mmHg, the estimated   right ventricular systolic pressure is mildly elevated at 43.6 mmHg.  Aortic Valve: The aortic valve is normal. Mild aortic stenosis is present.  Pulmonic Valve: The pulmonic valve is normal.  [place] MD  Electronically signed by [place] MD  Signature Date/Time: 03/28/2013/1:00:54 PM    *** Final ***    IMPRESSION: .        Verified By: Yolonda Kida, M.D., MD  CT:    01-Dec-14 10:08, CT Head Without Contrast  CT Head Without Contrast   REASON FOR EXAM:    felk hit  head  COMMENTS:       PROCEDURE: CT  - CT HEAD WITHOUT CONTRAST  - Mar 27 2013 10:08AM     CLINICAL DATA:  [place] and hit head    EXAM:  CT HEAD WITHOUT CONTRAST    TECHNIQUE:  Contiguous axial images were obtained from the base of the skull  through the vertex without intravenous contrast.    COMPARISON:  CT 12/27/2012  FINDINGS:  Moderate atrophy. Negative for hydrocephalus. 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 No acute infarct. Negative for  hemorrhage or mass. No midline shift. Negative for skull fracture.     IMPRESSION:  No acute abnormality and no change from the prior study.      Electronically Signed    By: Franchot Gallo M.D.    On: 03/27/2013 10:12         Verified By: Truett Perna, M.D.,    01-Dec-14 10:10, CT Cervical Spine Without Contrast  CT Cervical Spine Without Contrast   REASON FOR EXAM:    fell, h it head  COMMENTS:       PROCEDURE: CT  - CT CERVICAL SPINE WO  - Mar 27 2013 10:10AM     CLINICAL DATA:  Fall.    EXAM:  CT CERVICAL SPINE WITHOUT CONTRAST    TECHNIQUE:  Multidetector CT imaging of the cervical spine wasperformed without  intravenous contrast. Multiplanar CT image reconstructions were also  generated.  COMPARISON:  C-spine MRI  09/03/2011    FINDINGS:  Vertebral body alignment and heights are within normal. There is  moderate spondylosis throughout the cervical spine. There is disc  space narrowing from the C3-4 level through the C7-T1 level.  Prevertebral soft tissues as well as a atlantoaxial articulation are  within normal. There is moderate uncovertebral joint spurring and  facet arthropathy.There is moderate bilateral neural foraminal  narrowing at multiple levels. There is no acute fracture or  subluxation. There is a suggestion of minimal canal stenosis at the  C4-5 and C5-6 levels unchanged. Remainder the exam is unremarkable.     IMPRESSION:  No acute cervical spine injury.  Moderate spondylosis with multilevel degenerative disc disease and  significant bilateral multilevel neural foraminal narrowing.  Possible mild canal stenosis at C4-5 and C5-6 levels unchanged.      Electronically Signed    By: Marin Olp M.D.    On: 03/27/2013 10:21         Verified By: Pearletha Alfred, M.D.,   Assessment/Plan:  Invasive Device  Daily Assessment of Necessity:  Does the patient currently have any of the following indwelling devices? none   Assessment/Plan:  Assessment IMP AFIB Elevated troponin HTN Tempral Arteritis DVT Spinal stenosis Mod pul HTN ARI/CRI .   Plan PLAN Agree with IV heparin for DVT Doubt MI ,probably demand ischemia F/U with nephrology for renal insuff Rate control for AFIB Will probably anticoug for DVT so it should cover AFIB also I am worried about his fall risk IVC filter should be considered as a possible alternative? 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

## 2014-08-17 NOTE — H&P (Signed)
PATIENT NAME:  Ian Avery, [name]:  [id number] DATE OF BIRTH:  [birth year]  DATE OF ADMISSION:  03/27/2013  PRIMARY CARE PHYSICIAN: Dr. [name] from [facility].   CHIEF COMPLAINT: Found down, confused, dehydrated.   REFERRING PHYSICIAN: [name].   HISTORY OF PRESENT ILLNESS: A 81 year old gentleman with history of previous strokes, hypertension, hyperlipidemia, recent diagnosis of temporal arteritis with blindness of the right eye, gout and possible non-STEMI in the past, comes with a history of falls. Apparently, the patient has been falling for over a week, several times at least the past week, a week ago on Saturday and last night actually he fell again. He was found down by the family. Apparently, the patient did not make it to bed last night because the bed was not undone.  The sheets were still perfectly placed on the bed, no signs of the patient being in bed at all. He was on the floor laying down and very confused. The family called EMS. The patient was picked up and brought here to the Emergency Department day. They state that he has been getting a little bit more confused in the last couple of days, that he is very into himself now, less talkative since his last stroke but within the last several weeks after he was diagnosed with temporal arteritis and being on steroids, he has continued to get more depressed. The patient has been put on high-dose steroids but there still has not been any significant improvement of his vision. The patient has changed primary providers before he used to go to [facility], now he goes to Dr. [name] in the [facility].   The patient states that there is no significant pain or any other problems. He just felt down and could not get up for what he slept on the floor. I had a long discussion with the family. Apparently, the patient has being feeling pretty cold lately but he has not had any chills or fever. He lives by himself. He gets around well in spite of the  fact of the blindness of his right eye. He has had several people coming to modify his house give him education about that the problem. His power of attorney is his daughter and she lives about 5 miles from his house.   Today, the patient comes today and is found to be in acute kidney injury, dehydrated with elevation of white blood cells likely secondary to possible urinary tract infection versus just volume contraction. The patient gets a little bit emotional after certain parts of our conversation and cries. The patient is admitted for treatment and evaluation of this condition. Also, I got a report recently that ultrasound of the lower extremity showed DVT.    REVIEW OF SYSTEMS: A 12-system review of systems is done with help of the family.  CONSTITUTIONAL:  No fever, no chills, but the patient feels a little bit cold. There is no significant weight loss or significant weight gain.  EYES: No blurry vision, double vision on the left eye. His right eye is blind due to temporal arteritis. The patient states that he can see occasionally some light on a background but nothing else.  EARS, NOSE, THROAT: No tinnitus. No difficulty swallowing. No sinus pain.  RESPIRATORY: No cough, no wheezing. No hemoptysis. No painful respirations. No COPD. The patient occasionally has shortness of breath with exertion whenever he goes to the bathroom.  This has been within the last couple of days because of significant weakness.  CARDIOVASCULAR: No chest pain or orthopnea. No palpitations. Positive edema of the lower extremity that happened whenever he is on his feet for a while. He has left greater than right edema of the lower extremities today.  GENITOURINARY: No dysuria, hematuria, changes in urinary frequency.  ENDOCRINE: No polyuria, polydipsia, polyphagia, cold or heat intolerance.  HEMATOLOGIC AND LYMPHATIC: No anemia, easy bruising or bleeding.  SKIN:  No rashes, petechiae or new lesions. The patient had some  abrasions of the skin after the  fall from yesterday.  MUSCULOSKELETAL: No significant neck pain or back pain at this moment. No joint effusions. Positive gout.  NEUROLOGIC: No numbness or tingling. Positive fatigue. Positive vertigo occasionally. Positive CVAs in the past. The family denies any history of dementia. The patient has been confused just acutely lady.  PSYCHIATRIC: Worsening depression.   PAST MEDICAL HISTORY: 1.  CVA in 2011 and also in 2013 with left-sided weakness and speech disturbances. No deficits currently, full resolution.  2.  Hypertension.  3.  Hypercholesterolemia.  4.  BPH.   5.  Recent diagnosis of temporal arteritis.  6.  Gout.  7.  Possible non-ST elevation myocardial infarction in the past, not in records here.   ALLERGIES: No known drug allergies.   SOCIAL HISTORY: The patient lives by himself. He used to smoke, he quit in 1991. He smoked for over 30 years, unknown how much, the patient does not recall. No history of alcohol or drug abuse. His power of attorney is his daughter, [name]. He lives by himself and the house has undergone some changes due to his new blindness on the right side.   FAMILY HISTORY: Positive for CVAs in both parents, diabetes in his father and hypertension in his mother. His father had an MI as well. No cancer in the history.    CURRENT MEDICATIONS: Include vitamin D 5000 units weekly, Toprol 25 mg daily, Flomax 0.4 mg daily, ramipril 10 mg 2 capsules once a day, prednisone 60 mg daily, potassium chloride 10 mEq daily, Lipitor 40 mg daily, Lasix 40 mg daily, finasteride 5 mg daily, citalopram 10 mg daily, aspirin 81 mg daily, amlodipine 5 mg daily.   PHYSICAL EXAMINATION: VITAL SIGNS: Blood pressure 117/77, pulse 86, respirations 18, temperature 97.6, oxygen saturation 99% on 2 liters of oxygen. In the Emergency Department pulse was up to 100, respiratory rate of 20.  GENERAL: The patient is alert and oriented x 3, in no acute distress. No  respiratory distress. He is hemodynamically stable.  HEENT: His pupils are equal, left one is reactive to light and accommodation. The right pupil has no reaction to light or accommodation either. Campimetry is done, the patient is not able to see anything out of his right eye.  Left eye seems to be intact. Extraocular movements are intact. Mucosae are really dry. Anicteric sclerae. Pink conjunctivae. No oral lesions. No oropharyngeal exudates.  NECK: Supple. No JVD. No thyromegaly. No adenopathy. No carotid bruits.  CARDIOVASCULAR: Regular rate and rhythm. No murmurs, rubs or gallops are appreciated at this moment. No displacement of PMI.  LUNGS: Clear without any wheezing or crepitus. No use of accessory muscles. Decreased respiratory sounds in both bases.  ABDOMEN: Obese. No tenderness. No distention. No hepatosplenomegaly. No masses. Bowel sounds are positive. GENITAL: Negative for external lesions.  EXTREMITIES: No cyanosis or clubbing. Positive edema of the left lower extremity more than the right. There is trace edema on the right side, left lower extremity +2 at the level of the  foot, ankle and pretibial area.  VASCULAR: Capillary refill less than 3 seconds, pulses +2 bilateral extremities.  SKIN: No rashes, petechiae or new lesions.  LYMPHATIC: Negative for lymphadenopathy in neck or supraclavicular areas.  NEUROLOGIC: Cranial nerves II through XII intact except for blindness of the right eye. Strength seems to be equal in all 4 extremities, 4 out of 5. PSYCHIATRIC: No significant agitation. Patient gets very emotional, especially when I asked him about CODE STATUS. They have decided to wait until tomorrow because he feels like he should be a DO NOT RESUSCITATE but he is going to be discussing this with the family. For now he is a full code.  MUSCULOSKELETAL: No joint deformities or effusions.   RESULTS: 1.  EKG reported as atrial fibrillation although some traces could be interpreted as an  atrial flutter. The patient does not have previous history of atrial fibrillation or atrial flutter which is rate controlled.  2.  CT of the head negative for bleeding or acute stroke.  3.  CT of the neck some spinal canal stenosis and some spondylitis, nothing acute.  4.  Chest x-ray no significant changes related with pneumonia or airspace disease. There is cardiomegaly but no CHF.   5.  Ultrasound of the lower extremity shows evidence of acute thrombus within the popliteal veins, partially occlusive proximal and occlusive over the distal aspect of the popliteal vein.  6.  BNP is 2000 although there are no clinical signs of CHF.   7.  BUN 53, creatinine 2.54, glucose 77. Calcium is 11.8. GFR is around 27, sodium 138, potassium 4.8. Total protein 6, albumin 3, AST 48.  8.  Troponin is slightly elevated at 0.29.  9.  White blood count is 26,000, hemoglobin 14, platelet count is low at 91.  10.  Urinalysis shows 16 white blood cells, 64 red blood cells, positive blood, positive protein.  11.  Total CK is 205, no rhabdomyolysis.   ASSESSMENT AND PLAN: A 81 year old gentleman with history of stroke, systemic hypertension, hypercholesterolemia, benign prostatic hypertrophy, previous non-ST-elevation myocardial infarction, gout and temporal arteritis comes with fall, unable to get up, found down in the morning, increased depression, severe dehydration, acute kidney injury and deep venous thrombosis.  1.  Acute kidney injury. The patient has acute kidney injury likely due to intravascular volume depletion as the patient was not eating or drinking well for several days due to possible worsening depression. Then he was down on the floor all night yesterday, unknown if he has been eating or drinking since yesterday. The patient looks severely dehydrated for what we are going to put him on IV fluids, 100 mL an hour, monitor constantly and keep him on telemetry as the patient has elevation of BNP and cardiomegaly  although no clear diagnosis of congestive heart failure.  Continue to monitor closely, follow up electrolytes, follow up creatinine.  2.  As far as his dehydration, continue IV fluid resuscitation.  3.  As far as his new-onset deep venous thrombosis, the patient is going to be anticoagulated with heparin due to his kidney function. The patient has a platelet count of 91 today. His platelets have been normal prior to today for what I think this is a consumptive process, consumption due to possible sepsis or also due to the deep venous thrombosis and acute kidney injury. We are going to monitor closely. The patient has not had any bleeding. We are going to anticoagulate him but if there is any significant drop of  platelets in that next 24 hours, we are going to stop the heparin.  The patient needs to be anticoagulated so the only other option that we could have is use fondaparinux which should be better as far as not dropping platelets even further. Consider the possibility of an ABC filter if this thrombocytopenia continues to be a problem. We are going to repeat platelet count later on this afternoon for 1800 and monitor from there. 4.  Possible urinary tract infection. The patient is going to be treated with Rocephin.  Urine culture has been sent. Blood cultures have been checked as well.  5.  Systemic inflammatory response syndrome with tachycardia, elevation of white blood cells. The patient is not febrile but is tachycardic and critically ill with acute kidney injury. We are going to start him on Rocephin and monitor blood cultures.   6.  Hypercalcemia likely due to dehydration. Continue to follow up. Recheck calcium in the morning.  7.  Elevation of troponin. This is likely secondary to either demand ischemia due to new-onset atrial fibrillation and atrial flutter versus troponin leak due to acute kidney injury. Troponin is going to be checked serially.  8.  Atrial flutter with atrial fibrillation, rate  controlled. We are going to consult cardiology. The patient is going to be anticoagulated anyway. Continue metoprolol. Echocardiogram has been ordered as well. The patient has not been hypoxic. There is a very low suspicion for pulmonary embolus but consider getting a V/Q scan if the patient becomes hypoxic. He is going to be anticoagulated, anyway as mentioned above.  9.  Coronary artery disease. Continue aspirin. This diagnosis is uncertain as by the family they have been told that in the past he had a non-ST elevation myocardial infarction although they were never aware of it.  10.  Temporal arteritis. Continue treatment with prednisone.  11.  Blindness. This is secondary to temporal arteritis and is stable.  12.  Deep vein thrombosis prophylaxis. The patient on heparin.  13.  Gastrointestinal prophylaxis. The patient on proton pump inhibitor b.i.d. due to the high-dose steroids.  14.  Other medical problems are stable. The patient remains to be a full code although he and the family are going to have a talk about possible doing DO NOT RESUSCITATE for the future.  15.  Transfer the care to Dr. [name] today, [facility].   I spent about 60 minutes with this patient.     ____________________________ Felipa Furnace, MD rsg:cs D: 03/27/2013 13:28:00 ET T: 03/27/2013 15:12:49 ET JOB#: [id number]  cc: Felipa Furnace, MD, <Dictator> Chayla Shands Juanda Chance MD ELECTRONICALLY SIGNED 04/06/2013 18:17

## 2014-08-17 NOTE — Consult Note (Signed)
Chief Complaint:  Subjective/Chief Complaint Pt sates to notbeable to get comfortable inbed. C/O generalized pain and soreness. He denies cp No worsening sob no f/c/s.   VITAL SIGNS/ANCILLARY NOTES: **Vital Signs.:   03-Dec-14 13:00  Vital Signs Type Routine  Temperature Temperature (F) 98.5  Celsius 36.9  Temperature Source oral  Pulse Pulse 111  Respirations Respirations 24  Systolic BP Systolic BP 96  Diastolic BP (mmHg) Diastolic BP (mmHg) 64  Mean BP 74  Pulse Ox % Pulse Ox % 99  Pulse Ox Activity Level  At rest  Oxygen Delivery 2L  *Intake and Output.:   03-Dec-14 14:52  Grand Totals Intake:   Output:      Net:   49 Hr.:  480  Weight Method Bed  Current Weight (lbs) (lbs) 220.8  Current Weight (kg) (kg) 100.1  Height (ft) (feet) 5  Height (in) (in) 11  Height (cm) centimeters 180.3  BSA (m2) 2.1  BMI (kg/m2) 30.7    16:30  Grand Totals Intake:  240 Output:      Net:  240 24 Hr.:  720  Percentage of Meal Eaten  75  Rehab Summary:   03-Dec-14 15:35  Rehab Progress Summary Rehab Progress Summary Physical Therapy  S: "I can't do anything today." With heavy encouragement pt agrees to bed exercises but refuses transfers, upright exercises, or ambulation.  A: Pt demonstrates decreased LE strength but is able to perform all exercises as requested. He refuses transfers or ambulation today. Increase in pain with RLE exercises.  P: continue physical therapy plan of care for gait training, strength, ROM, balance and mobility. Encourage ambulation.  Gait Training Comments not tested. pt refused  Anticipated Discharge Disposition  SNF/STR   Brief Assessment:  GEN well developed, well nourished, no acute distress, obese, disheveled   Cardiac Irregular  murmur present   Respiratory normal resp effort  clear BS   Gastrointestinal Normal   Gastrointestinal details normal Soft  Nontender  Bowel sounds normal   Lab Results: Hepatic:  03-Dec-14 13:01   Bilirubin,  Total 0.4  Alkaline Phosphatase  42 (45-117 NOTE: New Reference Range 03/17/13)  SGPT (ALT) 29  SGOT (AST) 24  Total Protein, Serum  4.1  Albumin, Serum  2.1  Routine Chem:  02-Dec-14 04:50   Glucose, Serum  130  BUN  43  Creatinine (comp)  1.64  Sodium, Serum 138  Potassium, Serum 4.1  Chloride, Serum 106  CO2, Serum 26  Calcium (Total), Serum 10.0  Osmolality (calc) 288  eGFR (African American)  46  eGFR (Non-African American)  39 (eGFR values <58m/min/1.73 m2 may be an indication of chronic kidney disease (CKD). Calculated eGFR is useful in patients with stable renal function. The eGFR calculation will not be reliable in acutely ill patients when serum creatinine is changing rapidly. It is not useful in  patients on dialysis. The eGFR calculation may not be applicable to patients at the low and high extremes of body sizes, pregnant women, and vegetarians.)  Anion Gap  6  Magnesium, Serum 2.2 (1.8-2.4 THERAPEUTIC RANGE: 4-7 mg/dL TOXIC: > 10 mg/dL  -----------------------)  03-Dec-14 04:18   Result Comment APTT - RESULTS VERIFIED BY REPEAT TESTING.  - NOTIFIED OF CRITICAL VALUE  - C/ STEVEN SYKES @0535  03-29-13. [facility]  - READ-BACK PROCESS PERFORMED.  Result(s) reported on 29 Mar 2013 at 05:37AM.    13:01   Glucose, Serum  130  BUN  41  Creatinine (comp)  1.95  Sodium, Serum 140  Potassium, Serum 4.2  Chloride, Serum  108  CO2, Serum 27  Calcium (Total), Serum 9.6  Osmolality (calc) 291  eGFR (African American)  37  eGFR (Non-African American)  32 (eGFR values <23m/min/1.73 m2 may be an indication of chronic kidney disease (CKD). Calculated eGFR is useful in patients with stable renal function. The eGFR calculation will not be reliable in acutely ill patients when serum creatinine is changing rapidly. It is not useful in  patients on dialysis. The eGFR calculation may not be applicable to patients at the low and high extremes of body sizes, pregnant women, and  vegetarians.)  Anion Gap  5  Routine Coag:  02-Dec-14 04:50   Activated PTT (APTT)  159.5 (A HCT value >55% may artifactually increase the APTT. In one study, the increase was an average of 19%. Reference: "Effect on Routine and Special Coagulation Testing Values of Citrate Anticoagulant Adjustment in Patients with High HCT Values." American Journal of Clinical Pathology 2006;126:400-405.)    17:18   Activated PTT (APTT)  106.6 (A HCT value >55% may artifactually increase the APTT. In one study, the increase was an average of 19%. Reference: "Effect on Routine and Special Coagulation Testing Values of Citrate Anticoagulant Adjustment in Patients with High HCT Values." American Journal of Clinical Pathology 2006;126:400-405.)  03-Dec-14 04:18   Activated PTT (APTT)  > 160.0 (A HCT value >55% may artifactually increase the APTT. In one study, the increase was an average of 19%. Reference: "Effect on Routine and Special Coagulation Testing Values of Citrate Anticoagulant Adjustment in Patients with High HCT Values." American Journal of Clinical Pathology 2006;126:400-405.)    13:01   Activated PTT (APTT)  112.5 (A HCT value >55% may artifactually increase the APTT. In one study, the increase was an average of 19%. Reference: "Effect on Routine and Special Coagulation Testing Values of Citrate Anticoagulant Adjustment in Patients with High HCT Values." American Journal of Clinical Pathology 2006;126:400-405.)    19:50   Activated PTT (APTT)  113.6 (A HCT value >55% may artifactually increase the APTT. In one study, the increase was an average of 19%. Reference: "Effect on Routine and Special Coagulation Testing Values of Citrate Anticoagulant Adjustment in Patients with High HCT Values." American Journal of Clinical Pathology 2006;126:400-405.)  Routine Hem:  02-Dec-14 04:50   WBC (CBC)  15.2  RBC (CBC)  3.76  Hemoglobin (CBC)  11.8  Hematocrit (CBC)  34.4  Platelet Count (CBC)  82   MCV 91  MCH 31.5  MCHC 34.4  RDW 13.7  Neutrophil % 92.3  Lymphocyte % 1.7  Monocyte % 5.7  Eosinophil % 0.0  Basophil % 0.3  Neutrophil #  14.0  Lymphocyte #  0.3  Monocyte # 0.9  Eosinophil # 0.0  Basophil # 0.0 (Result(s) reported on 28 Mar 2013 at 05:39AM.)   Radiology Results: XRay:    01-Dec-14 10:34, Chest PA and Lateral  Chest PA and Lateral   REASON FOR EXAM:    weak  COMMENTS:       PROCEDURE: DXR - DXR CHEST PA (OR AP) AND LATERAL  - Mar 27 2013 10:34AM     CLINICAL DATA:  Weakness and fall.    EXAM:  CHEST - 2 VIEW    COMPARISON:  None    FINDINGS:  There is no evidence of pulmonary edema, consolidation,  pneumothorax, nodule or pleural fluid. The heart is enlarged. The  aorta is mildly ectatic. The bony thorax shows degenerative changes  of the thoracic  spine without visible fracture or bony lesion.     IMPRESSION:  No active disease.  Cardiomegaly without pulmonary edema.      Electronically Signed    By: Aletta Edouard M.D.    On: 03/27/2013 10:48         Verified By: Azzie Roup, M.D.,  [place]:    01-Dec-14 12:09, [place] Color Flow Doppler Low Extrem Left (Leg)  [place] Color Flow Doppler Low Extrem Left (Leg)   REASON FOR EXAM:    swelling significantly more than right  COMMENTS:       PROCEDURE: [place]  - US DOPPLER LOW EXTR LEFT  - Mar 27 2013 12:09PM     CLINICAL DATA:  Left lower extremity swelling.    EXAM:  Left LOWER EXTREMITY VENOUS DOPPLER ULTRASOUND    TECHNIQUE:  Gray-scale sonography with graded compression, as well as color  Doppler and duplex ultrasound, were performed to evaluate the deep  venous system from the level of the common femoral vein through the  popliteal and proximal calf veins. Spectral Doppler was utilized to  evaluate flow at rest and with distal augmentation maneuvers.    COMPARISON:  None.    FINDINGS:  Thrombus within deep veins: Nonocclusive thrombus within the  proximal popliteal vein which becomes  occlusive over the more distal  popliteal vein.    Compressibility of deep veins:  Noncompressible popliteal vein.    Duplex waveform respiratory phasicity: Abnormal over the popliteal  vein.    Duplex waveform response to augmentation: Abnormal over the  popliteal vein.    Venous reflux:  None visualized.    Other findings:  Remainder of the venous system is unremarkable.     IMPRESSION:  Evidence of acute thrombus within the popliteal vein which is  partially occlusive proximally and occlusive over the distal aspect  of the popliteal vein.    Technologist called the report at time of dictation.      Electronically Signed    By: Marin Olp M.D.    On: 03/27/2013 12:35         Verified By: Pearletha Alfred, M.D.,    01-Dec-14 14:27, US Kidney Bilateral  US Kidney Bilateral   REASON FOR EXAM:    AKI  COMMENTS:       PROCEDURE: [place]  - US KIDNEY  - Mar 27 2013  2:27PM     CLINICAL DATA:  Acute renal insufficiency.    EXAM:  RENAL/URINARY TRACT ULTRASOUND COMPLETE    COMPARISON:  None.    FINDINGS:  Right Kidney:  Length: 11.0 cm. Echogenicity within normal limits. No mass or  hydronephrosis visualized. Multiple nonobstructing stones with the  largest measuring approximately 1 cm over the mid pole.    Left Kidney:    Length: 10.9 cm. Echogenicity within normal limits. No  hydronephrosis visualized. 1 cm cyst over the mid pole. Multiple  nonobstructing stones with the largest over the mid pole measuring  approximately 1 cm.    Bladder:    Appears normal for degree of bladder distention.   IMPRESSION:  Multiple bilateral nonobstructing renal stones.    1 cm left renal cyst.      Electronically Signed    By: Marin Olp M.D.    On: 03/27/2013 14:48         Verified By: Pearletha Alfred, M.D.,  Cardiology:    01-Dec-14 09:14, ED ECG  Ventricular Rate 97  Atrial Rate 416  QRS Duration  72  QT 336  QTc 426  R Axis 12  T Axis 41  ECG  interpretation   *** Poor data quality, interpretation may be adversely affected  Atrial fibrillation  Inferior infarct (cited on or before 03-Sep-2011)  Abnormal ECG  When compared with ECG of 03-Sep-2011 01:18,  Atrial fibrillation has replaced Sinus rhythm  Right bundle branch block is no longer Present  ----------unconfirmed----------  Confirmed by OVERREAD, NOT ([id number]), editor PEARSON, BARBARA ([id number]) on 03/28/2013 1:29:10 PM  ED ECG     01-Dec-14 18:08, Echo Doppler  Echo Doppler   REASON FOR EXAM:      COMMENTS:       PROCEDURE: [place] - ECHO DOPPLER COMPLETE(TRANSTHOR)  - Mar 27 2013  6:08PM     RESULT: Echocardiogram Report    Patient Name:   Ian Avery Date of Exam: 03/27/2013  Medical Rec #:  [id number]             Custom1:  Date of Birth:  12/07/1932           Height:       71.0 in  Patient Age:    38 years           Weight:       221.0 lb  Patient Gender: M                  BSA:          2.20 m??    Indications: Atrial Fib  Sonographer:    Arville Go RDCS  Referring Phys: [facility], JASMINE    Sonographer Comments: Suboptimal apical window and suboptimal subcostal   window.    Summary:   1. Left ventricular ejection fraction, by visual estimation, is 70 to   75%.   2. Normal global left ventricular systolic function.   3. Mildly dilated left atrium.   4. Mildly dilated right atrium.   5. Mild mitral valve regurgitation.   6. Mild thickening of the anterior and posterior mitral valve leaflets.   7. Mild aortic valve stenosis.   8. Mildly elevated pulmonary artery systolic pressure.   9. Moderately increased left ventricular posterior wall thickness.  10. Mild tricuspid regurgitation.  2D AND M-MODE MEASUREMENTS (normal ranges within parentheses):  Left Ventricle:          Normal  IVSd (2D):      1.63 cm (0.7-1.1)  LVPWd (2D):     1.42 cm (0.7-1.1) Aorta/LA:                  Normal  LVIDd (2D):     4.00 cm (3.4-5.7) Aortic Root (2D): 3.50 cm (2.4-3.7)  LVIDs  (2D):     2.39 cm           Left Atrium (2D): 4.90 cm (1.9-4.0)  LV FS (2D):     40.3 %   (>25%)  LV EF (2D):     71.5 %   (>50%)                                    Right Ventricle:                                    RVd (2D):  SPECTRAL DOPPLER ANALYSIS (where applicable):  Mitral Valve:  MV Max Vel:  1.14 m/s MV P1/2 Time: 79.00 msec  MV Mean Grad: 2.0 mmHg MV Area, PHT: 2.78 cm??  Aortic Valve: AoV Max Vel: 2.49 m/s AoV Peak PG: 24.9 mmHg AoV Mean PG:   13.0 mmHg  LVOT Vmax: 1.52 m/s LVOT VTI: 0.219 m LVOT Diameter: 1.80 cm  AoV Area, Vmax: 1.55 cm?? AoV Area, VTI: 1.48 cm?? AoV Area, Vmn: 1.46 cm??  Tricuspid Valve and PA/RV Systolic Pressure: TR Max Velocity: 2.90 m/s RA   Pressure: 10 mmHg RVSP/PASP: 43.6 mmHg  Pulmonic Valve:  PV Max Velocity: 1.49 m/s PV Max PG: 8.9 mmHg PV Mean PG:    PHYSICIAN INTERPRETATION:  Left Ventricle: The left ventricular internal cavity size was normal. LV   septal wall thickness was normal. LV posterior wall thickness was   moderately increased. Global LV systolic function was normal. Left   ventricular ejection fraction, by visual estimation, is 70 to 75%.  Right Ventricle: The right ventricular size is mildly enlarged. Global RV   systolic function is normal.  Left Atrium: The left atrium is mildly dilated.  Right Atrium: The right atrium is mildly dilated.  Pericardium: There is no evidence of pericardial effusion.  Mitral Valve: The mitral valve is normal in structure. There is mild   thickening of the anterior and posterior mitral valve leaflets. Mild   mitral valve regurgitation is seen.  Tricuspid Valve: The tricuspid valve is normal. Mild tricuspid   regurgitation is visualized. The tricuspid regurgitant velocity is 2.90   m/s, and with an assumed right atrial pressure of 10 mmHg, the estimated   right ventricular systolic pressure is mildly elevated at 43.6 mmHg.  Aortic Valve: The aortic valve is normal. Mild aortic stenosis is  present.  Pulmonic Valve: The pulmonic valve is normal.  [place] MD  Electronically signed by [place] MD  Signature Date/Time: 03/28/2013/1:00:54 PM    *** Final ***    IMPRESSION: .        Verified By: Yolonda Kida, M.D., MD  CT:    01-Dec-14 10:08, CT Head Without Contrast  CT Head Without Contrast   REASON FOR EXAM:    felk hit  head  COMMENTS:       PROCEDURE: CT  - CT HEAD WITHOUT CONTRAST  - Mar 27 2013 10:08AM     CLINICAL DATA:  [place] and hit head    EXAM:  CT HEAD WITHOUT CONTRAST    TECHNIQUE:  Contiguous axial images were obtained from the base of the skull  through the vertex without intravenous contrast.    COMPARISON:  CT 12/27/2012  FINDINGS:  Moderate atrophy. Negative for hydrocephalus. 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 No acute infarct. Negative for  hemorrhage or mass. No midline shift. Negative for skull fracture.     IMPRESSION:  No acute abnormality and no change from the prior study.      Electronically Signed    By: Franchot Gallo M.D.    On: 03/27/2013 10:12         Verified By: Truett Perna, M.D.,    01-Dec-14 10:10, CT Cervical Spine Without Contrast  CT Cervical Spine Without Contrast   REASON FOR EXAM:    fell, h it head  COMMENTS:       PROCEDURE: CT  - CT CERVICAL SPINE WO  - Mar 27 2013 10:10AM     CLINICAL DATA:  Fall.    EXAM:  CT CERVICAL SPINE  WITHOUT CONTRAST    TECHNIQUE:  Multidetector CT imaging of the cervical spine wasperformed without  intravenous contrast. Multiplanar CT image reconstructions were also  generated.  COMPARISON:  C-spine MRI 09/03/2011    FINDINGS:  Vertebral body alignment and heights are within normal. There is  moderate spondylosis throughout the cervical spine. There is disc  space narrowing from the C3-4 level through the C7-T1 level.  Prevertebral soft tissues as well as a atlantoaxial articulation are  within normal. There is  moderate uncovertebral joint spurring and  facet arthropathy.There is moderate bilateral neural foraminal  narrowing at multiple levels. There is no acute fracture or  subluxation. There is a suggestion of minimal canal stenosis at the  C4-5 and C5-6 levels unchanged. Remainder the exam is unremarkable.     IMPRESSION:  No acute cervical spine injury.  Moderate spondylosis with multilevel degenerative disc disease and  significant bilateral multilevel neural foraminal narrowing.  Possible mild canal stenosis at C4-5 and C5-6 levels unchanged.      Electronically Signed    By: Marin Olp M.D.    On: 03/27/2013 10:21         Verified By: Pearletha Alfred, M.D.,   Assessment/Plan:  Assessment/Plan:  Assessment IMP AFIB controlled Demand ischemia ARI/CRI Obesity Tempral Arteritis Blindness DVT Elevated WBC Anemia .   Plan PLAN Continue anticoug for DVT/AFIB Agree heparin IV -then switch to coumadin Continue mild hydration Agree with renal w/u Continue steroid therapy for TA F/U WBC as it improves I do not rec invasive cardiac w/u Rec medicalcardiac therapy Rehab PT/OT I am still concerned able his fall risk   Electronic Signatures: Lujean Amel D (MD)  (Signed 03-Dec-14 22:34)  Authored: Chief Complaint, VITAL SIGNS/ANCILLARY NOTES, Brief Assessment, Lab Results, Radiology Results, Assessment/Plan   Last Updated: 03-Dec-14 22:34 by Yolonda Kida (MD)

## 2014-08-17 NOTE — Consult Note (Signed)
Brief Consult Note: Diagnosis: 2 localized proximal sigmoid perforations, sepsis.   Patient was seen by consultant.   Discussed with Attending MD.   Comments: [id number] yom just d/c-ed yesterday known to us, who developed AMS today in rehab and began c/o abd pain. ATN and hypercalcemia worse (Cr 2.2 to 1.2 to 2.1, Ca++ up to 10.9), WBC 18 to 12 to 26 tonight, hypotensive. Has two separate 3 cm fluid and air collections adjacent to prox sigmoid colon, as well as very thin renal corices and uroliths on CT. Obese. On exam, he is actually pretty non-tender. He is DNAR, and I agree with plan for fluid resuscitation and IV ABx, as he is a poor operative candidate. Will follow.  Electronic Signatures: Claude MangesMarterre, Thurley Francesconi F (MD)  (Signed 11-Dec-14 21:08)  Authored: Brief Consult Note   Last Updated: 11-Dec-14 21:08 by Claude MangesMarterre, Fizza Scales F (MD)

## 2014-08-17 NOTE — Consult Note (Signed)
Brief Consult Note: Diagnosis: history of cva admited with urosepsis and noted to have a fib wiht vaiable ventricular response.   Patient was seen by consultant.   Recommend further assessment or treatment.   Comments: Pt with history of cva admitted with sepsis. He is in afib with variable ventricular repsonse. The afib appears to be subscute. [facility]. Records do not discuss arrythmia. Pt was in afib during an earlier admission this month. He has frequent falls per his family member. Despite elevated CHADSS score, he appears to be at high risk at this point for chronic anticoabulation. Will revisit this at time of discharge depending on patients clinical and ambulatory status. WIll continue with current meds and follow heart rate.  Electronic Signatures: Dalia HeadingFath, Zaylynn Rickett A (MD)  (Signed 16-Dec-14 19:26)  Authored: Brief Consult Note   Last Updated: 16-Dec-14 19:26 by Dalia HeadingFath, Gurpreet Mikhail A (MD)

## 2014-08-17 NOTE — H&P (Signed)
PATIENT NAME:  Ian Avery, Ian Avery MR#:  [id number] DATE OF BIRTH:  02/17/1933  DATE OF ADMISSION:  04/06/2013  CHIEF COMPLAINT: Altered mental status.   HISTORY OF PRESENT ILLNESS: A 81 year old male with recent hospitalization for left popliteal DVT, complicated by right thigh hematoma, who underwent IVC filter placement on 03/31/2013. His course was further complicated by development of diverticulitis. He had been discharged on December 10th after being hydrated and placed on antibiotics. He has a history of chronic steroid use secondary to temporal arteritis and had been on 60 mg of prednisone. Over the 24 hours of being out of the hospital, he has developed worsening obtundation, has been unable able to eat, has had no bowel function and was brought back in by family members secondary to concerns about his worsening mental status. On arrival, he was found to have acute renal failure, hypotension with a systolic pressure of 75, with a white count 26,000. CT of the abdomen reveals what looks like developing abscess along the proximal sigmoid colon and anterior to the sigmoid colon with additional localized gas within the anterior abdominal mesentery. He is admitted now with sepsis secondary to diverticulitis, with acute renal failure, metabolic encephalopathy secondary to the above. After discussion with the patient's family members, they state that he would not wish to be on life support, would not want to undergo CPR, although they are willing to have him undergo pressors if necessary. He will be admitted to the intensive care unit as a limited code, chemical code only.   PAST MEDICAL HISTORY:  1. Recent left popliteal DVT complicated by right thigh hematoma, status post IVC filter placement.  2. 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  3. 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  4. Hypertension.  5. Hyperlipidemia.  6. BPH.   7.  Gout.  8. Report of coronary artery disease with prior non-ST elevation MI.    ALLERGIES: No known drug allergies.   MEDICATIONS AT TIME OF DISCHARGE:  1. Finasteride 5 mg p.o. daily.  2. Tamsulosin 0.4 mg p.o. daily.  3. Ramipril 10 mg p.o. b.i.d.  4. Vitamin D 5000 units p.o. q. week.  5. Lipitor 40 mg p.o. at bedtime.  6. Toprol-XL 25 mg p.o. daily.  7. Citalopram 10 mg p.o. daily.  8. Potassium 10 mEq p.o. daily.  9. Prednisone 60 mg p.o. daily.  10. Aspirin 81 mg p.o. daily.  11. Lasix 40 mg p.o. b.i.d.  12. Vitamin B12 1000 mcg p.o. daily.  13. Flagyl 500 mg p.o. t.i.d.  14. Pantoprazole 40 mg p.o. b.i.d.   15. Levaquin 750 mg p.o. daily.  16. Folate 1 mg p.o. daily.   SOCIAL HISTORY: Remote tobacco. No report of alcohol.   FAMILY HISTORY: Stroke, diabetes, hypertension.   REVIEW OF SYSTEMS: Please see HPI. Limited secondary to the patient's confusion. The patient answers "[employer]" when asked where he is currently. Denies significant pain to this examiner.   PHYSICAL EXAMINATION:  VITAL SIGNS: Pulse 118, blood pressure 78/55, saturation 93% on room air.  GENERAL: Disheveled male, appears critically ill.  EYES Decreased visual acuity appears to be present in the right eye but difficult to test.  EARS, NOSE THROAT: Very crowded oropharynx with dry oropharynx with ulceration of the lower lip.  NECK: Trachea midline. No thyromegaly.  CARDIOVASCULAR: Irregularly irregular and tachycardic without gallops or rubs. Carotid and radial pulses are 1+.  LUNGS: A few  crackles heard in the bases without wheeze or retractions.  ABDOMEN: Distended with absent bowel sounds. Minimal tenderness to deep palpation in the left lower quadrant without overt guard or rebound.  SKIN: Hyperpigmentation along the right thigh.  LYMPH NODES: No cervical or supraclavicular nodes.  MUSCULOSKELETAL: Diffuse edema in bilateral lower extremities, right more than left, with firm edema along the  right thigh.  NEUROLOGIC: Confused as noted above. Limited examination.    DATA: CT of the abdomen and pelvis revealing 2.7 x 3.1 cm early or developing abscess along the proximal sigmoid with 2.9 x 2.1 cm gas and fluid collection anterior to the sigmoid. CT of the head reveals no acute intracranial abnormality. Chest x-ray reveals atelectasis versus bibasilar infiltrate. White count 26.4, hemoglobin 12.1, platelets 124. Urinalysis with 23 red cells in a Foley catheter specimen. Troponin 0.21. BUN 32 with creatinine 2.06 and glucose 138.   IMPRESSION AND PLAN:  1. Diverticulitis, with possible abscess developing, with sepsis secondary to the above: The patient given vancomycin and Zosyn and will continue these with pharmacy to dose based on his acute renal failure. Surgical consultation has been obtained. At this point, watchful waiting. Fluid resuscitation, consideration for pressors. Move to the intensive care unit. Discussion with the family members, and they are aware that he is critically ill with high risk of death. Code status is LIMITED CODE as discussed above.  2. Coronary artery disease and hypertension: Holding all blood pressure medications, holding aspirin for now given his critical illness.  3. Recent hematoma and deep vein thrombosis: The patient has filter in place.    ____________________________ Lynnea FerrierBert J. Klein III, MD [id number] D: 04/06/2013 21:02:11 ET T: 04/06/2013 21:23:07 ET JOB#: [id number]  cc: Lynnea FerrierBert J. Klein III, MD, <Dictator> Daniel NonesBERT KLEIN MD ELECTRONICALLY SIGNED 04/10/2013 22:16

## 2014-08-17 NOTE — Op Note (Signed)
PATIENT NAME:  Ian CroftBOWMAN, Decorian R MR#:  [id number] DATE OF BIRTH:  1932/09/06  DATE OF PROCEDURE:  03/31/2013  PREOPERATIVE DIAGNOSES:  1. Left popliteal deep venous thrombosis.  2. Gastrointestinal bleed.  3. Anemia of blood loss.    POSTOPERATIVE DIAGNOSES:  1. Left popliteal deep venous thrombosis.  2. Gastrointestinal bleed.  3. Anemia of blood loss.    PROCEDURES PERFORMED:  1. Inferior venacavogram.  2. Placement of infrarenal inferior vena caval filter, Bard Meridian type.   SURGEON: Renford DillsGregory G. Schnier, M.D.   SEDATION: Versed 2 mg plus fentanyl 50 mcg administered IV. Continuous ECG, pulse oximetry and cardiopulmonary monitoring was performed throughout the entire procedure by the interventional radiology nurse. Total sedation time was 30 minutes.   ACCESS: Right IJ 9-French sheath.   FLUOROSCOPY TIME: Less than 1 minute.   CONTRAST USED: Isovue 20 mL.   INDICATIONS: [name] is a 81 year old gentleman who is admitted to the hospital and found to have DVT in the left popliteal. Subsequently, he has developed bright red blood per rectum, and his hemoglobin has dropped from 11.8 yesterday to 6.1 today. His heparin drip has, therefore, been discontinued and he requires IVC filter placement to prevent lethal pulmonary embolism.   DESCRIPTION OF PROCEDURE: The patient is taken to special procedures and placed in the supine position. After adequate sedation has been achieved, his right neck is prepped and draped in a sterile fashion. Ultrasound is placed in a sterile sleeve. Jugular vein is identified. It is echolucent and pulsatile indicating patency. Image is recorded for the permanent record. Under direct visualization, the microneedle is inserted, microwire followed by microsheath. J-wire is then advanced under fluoroscopy. A KMP catheter is advanced bareback over the wire, and the wire is negotiated into the IVC. The catheter is removed. The delivery sheath and dilator are then  advanced over the wire and positioned just above the iliac confluence, and bolus injection of contrast is utilized to demonstrate the anatomy of the IVC. After review of the images, the cava measures 20 mm, and the renal blushes are noted at the level of L2. The dilator is then removed, and the filter is advanced through the sheath. The filter is then uncovered deploying it at the L3 level.   The sheath is then pulled and light pressure is held, and there are no immediate complications.   ____________________________ Renford DillsGregory G. Schnier, MD ggs:gb D: 03/31/2013 17:45:30 ET T: 03/31/2013 21:33:11 ET JOB#: [id number]  cc: Renford DillsGregory G. Schnier, MD, <Dictator> Dwayne D. Juliann Paresallwood, MD Renford DillsGREGORY G SCHNIER MD ELECTRONICALLY SIGNED 04/03/2013 17:26

## 2014-08-17 NOTE — Consult Note (Signed)
CHIEF COMPLAINT and HISTORY:  Subjective/Chief Complaint Consulted for IVC filter   History of Present Illness 81 year old white male admitted with left popliteal DVT, dehydration, hypercalcemia and acute on chronic renal failure. 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 Patient had a drop in hemoglobin from 11.8 to 6.1 yesterday while on heparin drip with therapeutic PTT. No dark stools, BRBPR or other obvious mucosal bleeding. Patient has not had a bowel movement for two days. Heparin held and 2 untis PRBC given, today hbg is 8.4.   We were consulted for IVC filter placement since unable to anticoagulate due to drop in hgb.   PAST MEDICAL/SURGICAL HISTORY:  Past Medical History:   Gout:    DVT:    Temporal Arteritis:    CVA/Stroke:    Hypercholesterolemia:    Hypertension:   ALLERGIES:  Allergies:  No Known Allergies:   HOME MEDICATIONS:  Home Medications: Medication Instructions Status  tamsulosin 0.4 mg oral capsule 1 cap(s) orally once a day Active  ramipril 10 mg oral capsule 2 cap(s) orally once a day Active  Vitamin D3 5000 intl units oral capsule 1 cap(s) orally once a week Active  Lipitor 40 mg oral tablet 1 tab(s) orally once a day (at bedtime) Active  Toprol-XL 25 mg oral tablet, extended release 1 tab(s) orally once a day Active  citalopram 10 mg oral tablet 1 tab(s) orally once a day Active  potassium chloride 10 mEq oral capsule, extended release 1 cap(s) orally once a day Active  predniSONE 20 mg oral tablet 3 tab(s) orally once a day Active  aspirin 81 mg oral tablet 1 tab(s) orally once a day Active  Lasix 40 mg oral tablet orally 2 times a day Active  Vitamin B12 1000 mcg oral tablet 1 tab(s) orally once a day Active  amlodipine 5 mg oral tablet 1 tab(s) orally once a day Active  finasteride 5 mg oral tablet 1 tab(s) orally once a day Active   Family and Social History:  Family  History Hypertension   Social History negative ETOH, negative Illicit drugs   Review of Systems:  Fever/Chills No   Cough No   Sputum No   Abdominal Pain No   Diarrhea No   Nausea/Vomiting No   Chest Pain No   Physical Exam:  GEN no acute distress   HEENT PERRL, moist oral mucosa   NECK supple  No masses   RESP normal resp effort  clear BS   CARD irregular rate   ABD denies tenderness  soft  normal BS   LYMPH negative neck   EXTR negative cyanosis/clubbing, +LLE edema   NEURO cranial nerves intact   PSYCH alert   LABS:  Laboratory Results: LabObservation:    01-Dec-14 12:09, [place] Color Flow Doppler Low Extrem Left (Leg)  OBSERVATION   Reason for Test swelling significantly more than right  Hepatic:    03-Dec-14 13:01, Comprehensive Metabolic Panel  Bilirubin, Total 0.4  Alkaline Phosphatase 42  45-117  NOTE: New Reference Range  03/17/13  SGPT (ALT) 29  SGOT (AST) 24  Total Protein, Serum 4.1  Albumin, Serum 2.1    05-Dec-14 06:01, Renal Function Panel  Albumin, Serum 2.3  General Ref:    04-Dec-14 13:52, Vitamin D, 25-Hydroxy, Serum  Vitamin D, 25-Hydroxy, Serum   ========== TEST NAME ==========  ========= RESULTS =========  = REFERENCE RANGE =    VITAMIN D    Vitamin  D, 25-Hydroxy  Vitamin D, 25-Hydroxy           [L  19.0 ng/mL           ]        30.0-100.0  Vitamin D deficiency has been defined by the [employer] practice guideline as a  level of serum 25-OH vitamin D less than 20 ng/mL (1,2).  The Endocrine Society went on to further define vitamin D  insufficiency as a level between 21 and 29 ng/mL (2).  1. IOM (Institute of Medicine). 2010. Dietary reference     intakes for calcium and D. [place]: The     [employer].  2. Holick MF, [place], Bischoff-Ferrari HA, et al.     Evaluation, treatment, and prevention of vitamin D     deficiency: an Endocrine Society clinical  practice     guideline. JCEM. 2011 Jul; 96(7):1911-30.                  [place]            No: [id number]            [address] [place], [place] [address]            Lindon Romp, MD         [phone]     Result(s) reported on 31 Mar 2013 at 03:48AM.  Routine Chem:    03-Dec-14 13:01, Comprehensive Metabolic Panel  Glucose, Serum 130  BUN 41  Creatinine (comp) 1.95  Sodium, Serum 140  Potassium, Serum 4.2  Chloride, Serum 108  CO2, Serum 27  Calcium (Total), Serum 9.6  Osmolality (calc) 291  eGFR (African American) 37  eGFR (Non-African American) 32  eGFR values <74m/min/1.73 m2 may be an indication of chronic  kidney disease (CKD).  Calculated eGFR is useful in patients with stable renal function.  The eGFR calculation will not be reliable in acutely ill patients  when serum creatinine is changing rapidly. It is not useful in   patients on dialysis. The eGFR calculation may not be applicable  to patients at the low and high extremes of body sizes, pregnant  women, and vegetarians.  Anion Gap 5    05-Dec-14 06:01, Renal Function Panel  Glucose, Serum 93  BUN 40  Creatinine (comp) 1.84  Sodium, Serum 141  Potassium, Serum 4.4  Chloride, Serum 108  CO2, Serum 25  Calcium (Total), Serum 10.1  Phosphorus, Serum 3.3  Anion Gap 8  Osmolality (calc) 291  eGFR (African American) 40  eGFR (Non-African American) 34  eGFR values <618mmin/1.73 m2 may be an indication of chronic  kidney disease (CKD).  Calculated eGFR is useful in patients with stable renal function.  The eGFR calculation will not be reliable in acutely ill patients  when serum creatinine is changing rapidly. It is not useful in   patients on dialysis. The eGFR calculation may not be applicable  to patients at the low and high extremes of body sizes, pregnant  women, and vegetarians.  Routine Hem:    04-Dec-14 04:11, Hemoglobin  Hemoglobin (CBC) 6.1  Result(s) reported on 30 Mar 2013 at 04:27AM.   ASSESSMENT AND PLAN:  Assessment/Admission Diagnosis [id number] old white male admitted with left popliteal DVT, dehydration, hypercalcemia and acute on chronic renal failure. 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 Patient had a drop in  hemoglobin from 11.8 to 6.1 yesterday while on heparin drip with therapeutic PTT. No dark stools, BRBPR or other obvious mucosal bleeding. Patient has not had a bowel movement for two days. Heparin held and 2 untis PRBC given, today hbg is 8.4.   We were consulted for IVC filter placement since unable to anticoagulate due to drop in hgb.  D/w Dr. Delana Meyer. Will plan for IVC filter placement today.   Electronic Signatures: Su Grand (PA-C)  (Signed 05-Dec-14 11:20)  Authored: Chief Complaint and History, PAST MEDICAL/SURGICAL HISTORY, ALLERGIES, HOME MEDICATIONS, Family and Social History, Review of Systems, Physical Exam, LABS, Assessment and Plan   Last Updated: 05-Dec-14 11:20 by Su Grand (PA-C)

## 2014-08-17 NOTE — Consult Note (Signed)
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   Patient was seen by consultant.   Consult note dictated.   Recommend further assessment or treatment.   Orders entered.   Discussed with Attending MD.   Comments: All cxs are negative and he is continuing to improve clinically. I would suggest consolidate abx to meropenem 1 gm iv q 12 for broad coverage and ease of dosing at dc - dc zosyn and vanco - will likely need a picc line and several weeks of iv meropenem if no surgical intervention possible.  Electronic Signatures: Dierdre HarnessFitzgerald, David Patrick (MD)  (Signed 15-Dec-14 19:00)  Authored: Brief Consult Note   Last Updated: 15-Dec-14 19:00 by Dierdre HarnessFitzgerald, David Patrick (MD)

## 2014-08-17 NOTE — Consult Note (Signed)
VITAL SIGNS/ANCILLARY NOTES: **Vital Signs.:   04-Dec-14 13:35  Vital Signs Type Pre-Blood  Temperature Temperature (F) 98.7  Celsius 37  Temperature Source oral  Pulse Pulse 88  Respirations Respirations 18  Systolic BP Systolic BP 048  Diastolic BP (mmHg) Diastolic BP (mmHg) 50  Mean BP 66  Pulse Ox % Pulse Ox % 99  Oxygen Delivery 2L  *Intake and Output.:   Shift 04-Dec-14 15:00  Grand Totals Intake:  0 Output:  100    Net:  -100 24 Hr.:  -100  Urine ml     Out:  100  Length of Stay Totals Intake:  8891 Output:  6945    Net:  5433   Brief Assessment:  GEN well developed, well nourished, no acute distress, obese   Cardiac Regular  -- JVD  --Gallop   Respiratory normal resp effort  clear BS   Gastrointestinal Normal   Gastrointestinal details normal Soft   EXTR negative cyanosis/clubbing, negative edema   Lab Results: Routine BB:  04-Dec-14 09:30   Crossmatch Unit 1 Issued (Result(s) reported on 30 Mar 2013 at 02:00PM.)  ABO Group + Rh Type B Positive  Antibody Screen NEGATIVE (Result(s) reported on 30 Mar 2013 at 10:54AM.)    18:29   Crossmatch Unit 1 Ready (Result(s) reported on 30 Mar 2013 at 06:33PM.)  Routine Chem:  04-Dec-14 04:11   Glucose, Serum  132  BUN  41  Creatinine (comp)  1.80  Sodium, Serum 139  Potassium, Serum 4.3  Chloride, Serum  109  CO2, Serum 25  Calcium (Total), Serum 10.0  Anion Gap  5  Osmolality (calc) 290  eGFR (African American)  41  eGFR (Non-African American)  35 (eGFR values <58m/min/1.73 m2 may be an indication of chronic kidney disease (CKD). Calculated eGFR is useful in patients with stable renal function. The eGFR calculation will not be reliable in acutely ill patients when serum creatinine is changing rapidly. It is not useful in  patients on dialysis. The eGFR calculation may not be applicable to patients at the low and high extremes of body sizes, pregnant women, and vegetarians.)    13:52   Phosphorus,  Serum 3.0 (Result(s) reported on 30 Mar 2013 at 02:27PM.)  Uric Acid, Serum  9.1 (Result(s) reported on 30 Mar 2013 at 02:27PM.)  Cardiac:  04-Dec-14 13:52   CK, Total  246 (Result(s) reported on 30 Mar 2013 at 02:27PM.)  Routine Coag:  04-Dec-14 04:11   Activated PTT (APTT)  72.8 (A HCT value >55% may artifactually increase the APTT. In one study, the increase was an average of 19%. Reference: "Effect on Routine and Special Coagulation Testing Values of Citrate Anticoagulant Adjustment in Patients with High HCT Values." American Journal of Clinical Pathology 2006;126:400-405.)  Routine Hem:  04-Dec-14 04:11   Platelet Count (CBC)  102 (Result(s) reported on 30 Mar 2013 at 04:27AM.)  Hemoglobin (CBC)  6.1 (Result(s) reported on 30 Mar 2013 at 04:27AM.)   Radiology Results: XRay:    01-Dec-14 10:34, Chest PA and Lateral  Chest PA and Lateral   REASON FOR EXAM:    weak  COMMENTS:       PROCEDURE: DXR - DXR CHEST PA (OR AP) AND LATERAL  - Mar 27 2013 10:34AM     CLINICAL DATA:  Weakness and fall.    EXAM:  CHEST - 2 VIEW    COMPARISON:  None    FINDINGS:  There is no evidence of pulmonary edema, consolidation,  pneumothorax, nodule  or pleural fluid. The heart is enlarged. The  aorta is mildly ectatic. The bony thorax shows degenerative changes  of the thoracic spine without visible fracture or bony lesion.     IMPRESSION:  No active disease.  Cardiomegaly without pulmonary edema.      Electronically Signed    By: Aletta Edouard M.D.    On: 03/27/2013 10:48         Verified By: Azzie Roup, M.D.,  [place]:    01-Dec-14 12:09, [place] Color Flow Doppler Low Extrem Left (Leg)  [place] Color Flow Doppler Low Extrem Left (Leg)   REASON FOR EXAM:    swelling significantly more than right  COMMENTS:       PROCEDURE: [place]  - US DOPPLER LOW EXTR LEFT  - Mar 27 2013 12:09PM     CLINICAL DATA:  Left lower extremity swelling.    EXAM:  Left LOWER EXTREMITY VENOUS DOPPLER  ULTRASOUND    TECHNIQUE:  Gray-scale sonography with graded compression, as well as color  Doppler and duplex ultrasound, were performed to evaluate the deep  venous system from the level of the common femoral vein through the  popliteal and proximal calf veins. Spectral Doppler was utilized to  evaluate flow at rest and with distal augmentation maneuvers.    COMPARISON:  None.    FINDINGS:  Thrombus within deep veins: Nonocclusive thrombus within the  proximal popliteal vein which becomes occlusive over the more distal  popliteal vein.    Compressibility of deep veins:  Noncompressible popliteal vein.    Duplex waveform respiratory phasicity: Abnormal over the popliteal  vein.    Duplex waveform response to augmentation: Abnormal over the  popliteal vein.    Venous reflux:  None visualized.    Other findings:  Remainder of the venous system is unremarkable.     IMPRESSION:  Evidence of acute thrombus within the popliteal vein which is  partially occlusive proximally and occlusive over the distal aspect  of the popliteal vein.    Technologist called the report at time of dictation.      Electronically Signed    By: Marin Olp M.D.    On: 03/27/2013 12:35         Verified By: Pearletha Alfred, M.D.,    01-Dec-14 14:27, US Kidney Bilateral  US Kidney Bilateral   REASON FOR EXAM:    AKI  COMMENTS:       PROCEDURE: [place]  - US KIDNEY  - Mar 27 2013  2:27PM     CLINICAL DATA:  Acute renal insufficiency.    EXAM:  RENAL/URINARY TRACT ULTRASOUND COMPLETE    COMPARISON:  None.    FINDINGS:  Right Kidney:  Length: 11.0 cm. Echogenicity within normal limits. No mass or  hydronephrosis visualized. Multiple nonobstructing stones with the  largest measuring approximately 1 cm over the mid pole.    Left Kidney:    Length: 10.9 cm. Echogenicity within normal limits. No  hydronephrosis visualized. 1 cm cyst over the mid pole. Multiple  nonobstructing stones with the  largest over the mid pole measuring  approximately 1 cm.    Bladder:    Appears normal for degree of bladder distention.   IMPRESSION:  Multiple bilateral nonobstructing renal stones.    1 cm left renal cyst.      Electronically Signed    By: Marin Olp M.D.    On: 03/27/2013 14:48         Verified By:  Pearletha Alfred, M.D.,  Cardiology:    01-Dec-14 09:14, ED ECG  Ventricular Rate 97  Atrial Rate 416  QRS Duration 72  QT 336  QTc 426  R Axis 12  T Axis 41  ECG interpretation   *** Poor data quality, interpretation may be adversely affected  Atrial fibrillation  Inferior infarct (cited on or before 03-Sep-2011)  Abnormal ECG  When compared with ECG of 03-Sep-2011 01:18,  Atrial fibrillation has replaced Sinus rhythm  Right bundle branch block is no longer Present  ----------unconfirmed----------  Confirmed by OVERREAD, NOT ([id number]), editor PEARSON, BARBARA ([id number]) on 03/28/2013 1:29:10 PM  ED ECG     01-Dec-14 18:08, Echo Doppler  Echo Doppler   REASON FOR EXAM:      COMMENTS:       PROCEDURE: [place] - ECHO DOPPLER COMPLETE(TRANSTHOR)  - Mar 27 2013  6:08PM     RESULT: Echocardiogram Report    Patient Name:   Ian Avery Date of Exam: 03/27/2013  Medical Rec #:  [id number]             Custom1:  Date of Birth:  04/13/1933           Height:       71.0 in  Patient Age:    29 years           Weight:       221.0 lb  Patient Gender: M                  BSA:          2.20 m??    Indications: Atrial Fib  Sonographer:    Arville Go RDCS  Referring Phys: [facility] JASMINE    Sonographer Comments: Suboptimal apical window and suboptimal subcostal   window.    Summary:   1. Left ventricular ejection fraction, by visual estimation, is 70 to   75%.   2. Normal global left ventricular systolic function.   3. Mildly dilated left atrium.   4. Mildly dilated right atrium.   5. Mild mitral valve regurgitation.   6. Mild thickening of the anterior and posterior mitral valve  leaflets.   7. Mild aortic valve stenosis.   8. Mildly elevated pulmonary artery systolic pressure.   9. Moderately increased left ventricular posterior wall thickness.  10. Mild tricuspid regurgitation.  2D AND M-MODE MEASUREMENTS (normal ranges within parentheses):  Left Ventricle:          Normal  IVSd (2D):      1.63 cm (0.7-1.1)  LVPWd (2D):     1.42 cm (0.7-1.1) Aorta/LA:                  Normal  LVIDd (2D):     4.00 cm (3.4-5.7) Aortic Root (2D): 3.50 cm (2.4-3.7)  LVIDs (2D):     2.39 cm           Left Atrium (2D): 4.90 cm (1.9-4.0)  LV FS (2D):     40.3 %   (>25%)  LV EF (2D):     71.5 %   (>50%)                                    Right Ventricle:  RVd (2D):  SPECTRAL DOPPLER ANALYSIS (where applicable):  Mitral Valve:  MV Max Vel:   1.14 m/s MV P1/2 Time: 79.00 msec  MV Mean Grad: 2.0 mmHg MV Area, PHT: 2.78 cm??  Aortic Valve: AoV Max Vel: 2.49 m/s AoV Peak PG: 24.9 mmHg AoV Mean PG:   13.0 mmHg  LVOT Vmax: 1.52 m/s LVOT VTI: 0.219 m LVOT Diameter: 1.80 cm  AoV Area, Vmax: 1.55 cm?? AoV Area, VTI: 1.48 cm?? AoV Area, Vmn: 1.46 cm??  Tricuspid Valve and PA/RV Systolic Pressure: TR Max Velocity: 2.90 m/s RA   Pressure: 10 mmHg RVSP/PASP: 43.6 mmHg  Pulmonic Valve:  PV Max Velocity: 1.49 m/s PV Max PG: 8.9 mmHg PV Mean PG:    PHYSICIAN INTERPRETATION:  Left Ventricle: The left ventricular internal cavity size was normal. LV   septal wall thickness was normal. LV posterior wall thickness was   moderately increased. Global LV systolic function was normal. Left   ventricular ejection fraction, by visual estimation, is 70 to 75%.  Right Ventricle: The right ventricular size is mildly enlarged. Global RV   systolic function is normal.  Left Atrium: The left atrium is mildly dilated.  Right Atrium: The right atrium is mildly dilated.  Pericardium: There is no evidence of pericardial effusion.  Mitral Valve: The mitral valve is normal in  structure. There is mild   thickening of the anterior and posterior mitral valve leaflets. Mild   mitral valve regurgitation is seen.  Tricuspid Valve: The tricuspid valve is normal. Mild tricuspid   regurgitation is visualized. The tricuspid regurgitant velocity is 2.90   m/s, and with an assumed right atrial pressure of 10 mmHg, the estimated   right ventricular systolic pressure is mildly elevated at 43.6 mmHg.  Aortic Valve: The aortic valve is normal. Mild aortic stenosis is present.  Pulmonic Valve: The pulmonic valve is normal.  [place] MD  Electronically signed by [place] MD  Signature Date/Time: 03/28/2013/1:00:54 PM    *** Final ***    IMPRESSION: .        Verified By: Yolonda Kida, M.D., MD  CT:    01-Dec-14 10:08, CT Head Without Contrast  CT Head Without Contrast   REASON FOR EXAM:    felk hit  head  COMMENTS:       PROCEDURE: CT  - CT HEAD WITHOUT CONTRAST  - Mar 27 2013 10:08AM     CLINICAL DATA:  [place] and hit head    EXAM:  CT HEAD WITHOUT CONTRAST    TECHNIQUE:  Contiguous axial images were obtained from the base of the skull  through the vertex without intravenous contrast.    COMPARISON:  CT 12/27/2012  FINDINGS:  Moderate atrophy. Negative for hydrocephalus. 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 No acute infarct. Negative for  hemorrhage or mass. No midline shift. Negative for skull fracture.     IMPRESSION:  No acute abnormality and no change from the prior study.      Electronically Signed    By: Franchot Gallo M.D.    On: 03/27/2013 10:12         Verified By: Truett Perna, M.D.,    01-Dec-14 10:10, CT Cervical Spine Without Contrast  CT Cervical Spine Without Contrast   REASON FOR EXAM:    fell, h it head  COMMENTS:       PROCEDURE: CT  - CT CERVICAL SPINE WO  - Mar 27 2013  10:10AM     CLINICAL DATA:  Fall.    EXAM:  CT CERVICAL SPINE WITHOUT CONTRAST    TECHNIQUE:  Multidetector  CT imaging of the cervical spine wasperformed without  intravenous contrast. Multiplanar CT image reconstructions were also  generated.  COMPARISON:  C-spine MRI 09/03/2011    FINDINGS:  Vertebral body alignment and heights are within normal. There is  moderate spondylosis throughout the cervical spine. There is disc  space narrowing from the C3-4 level through the C7-T1 level.  Prevertebral soft tissues as well as a atlantoaxial articulation are  within normal. There is moderate uncovertebral joint spurring and  facet arthropathy.There is moderate bilateral neural foraminal  narrowing at multiple levels. There is no acute fracture or  subluxation. There is a suggestion of minimal canal stenosis at the  C4-5 and C5-6 levels unchanged. Remainder the exam is unremarkable.     IMPRESSION:  No acute cervical spine injury.  Moderate spondylosis with multilevel degenerative disc disease and  significant bilateral multilevel neural foraminal narrowing.  Possible mild canal stenosis at C4-5 and C5-6 levels unchanged.      Electronically Signed    By: Marin Olp M.D.    On: 03/27/2013 10:21         Verified By: Pearletha Alfred, M.D.,    04-Dec-14 13:50, CT Femur/Thigh Right Without Contrast  CT Femur/Thigh Right Without Contrast   REASON FOR EXAM:    Right thigh swollen  and hard  COMMENTS:       PROCEDURE: CT  - CT FEMUR [place] RIGHT WO  - Mar 30 2013  1:50PM     CLINICAL DATA:  Right thigh swelling and firmness.  History of DVT.    EXAM:  CT OF THE LOWER RIGHT EXTREMITY WITHOUT CONTRAST    TECHNIQUE:  Multidetector CT imaging of the right thigh was performed according  to the standard protocol. Multiplanar CT image reconstructions were  also generated.  COMPARISON:  None.    FINDINGS:  There is no evidence of acute fracture, dislocation or bone  destruction. At the hip, there is mild joint space loss. The femoral  head appears normal. At the knee, there are  tricompartmental  degenerative changes with meniscal chondrocalcinosis and multiple  peripherally calcified loose bodies in a small Baker's cyst. There  is no significant knee joint effusion.    Subcutaneous edema is noted throughout the anterolateral thigh,  greatest proximally. In the distal thigh, there is ill-defined fluid  anterior to the patella and lateral patellar retinaculum. There is  heterogeneous enlargement of the quadriceps musculature with  ill-defined low density in the vastus lateralis, measuring  approximately 4.4 x 2.1 cm transverse on axial images 165 of series  606. There are no apparent soft tissue calcifications in this area.    Diffuse vascular calcifications are noted. Vascular assessment is  limited without contrast.     IMPRESSION:  1. There is nonspecific enlargement of the quadriceps musculature  with ill-defined low-density in the vastus lateralis muscle in the  mid thigh. These findings could be secondary to myositis with early  abscess formation or prior muscular injury with hematoma. A mass  cannot be excluded by this examination. Clinical correlation and  follow-uprecommended. If more definitive characterization is  warranted, MRI without and with contrast could be performed.  2. Subcutaneous edema anterior laterally, nonspecific, but possibly  cellulitis or sequela of prior DVT.  3. Suspected prepatellar bursitis.  4. No acute osseous findings. There are degenerative  changes at the  knee with multiple loose bodies in a Baker's cyst.      Electronically Signed    By: Camie Patience M.D.    On: 03/30/2013 14:38         Verified By: Vivia Ewing, M.D.,  Nuclear Med:    04-Dec-14 13:00, GI Blood Loss Study - Nuc Med  GI Blood Loss Study - Nuc Med   REASON FOR EXAM:    HBG 5.4  COMMENTS:       PROCEDURE: NM  - NM GI BLOOD LOSS STUDY  - Mar 30 2013  1:00PM     CLINICAL DATA:  Gastrointestinal bleeding, low hemoglobin    EXAM:  NUCLEAR  MEDICINE GASTROINTESTINAL BLEEDING SCAN    TECHNIQUE:  Sequential abdominal images were obtained following intravenous  administration of Tc-77mlabeled red blood cells.    COMPARISON:  None.  RADIOPHARMACEUTICALS:  22 mCi Tc-952mn-vivo labeled red cells.    FINDINGS:  There is no accumulation of radiotracer within the abdomen or pelvis  to suggest active endoluminal bleeding within the GI tract. Blood  pool activity is noted as well as activity within the solid organs.     IMPRESSION:  No scintigraphic evidence of active gastrointestinal bleeding on 1  hr exam.      Electronically Signed    By: StSuzy Bouchard.D.    On: 03/30/2013 14:22     Verified By: JORennis GoldenM.D.,   Assessment/Plan:  Assessment/Plan:  Assessment IMP Anemia acutely DVT TA Obesity Confusion Blindness ARI/CRI Hypotension .   Plan PLAN Hold BP MEDS Hold heparin Hold ASA Hypdrate with fliuid for hypotension Transfuse 2 units PRBCs Consider CT of Abd/pelvis to r/o retroperitenial bleeding? Agree with nephrology w/u and evaluation Consider IVC filter instead of long term anticoug   Electronic Signatures: CaLujean Amel (MD)  (Signed 04-Dec-14 20:14)  Authored: VITAL SIGNS/ANCILLARY NOTES, Brief Assessment, Lab Results, Radiology Results, Assessment/Plan   Last Updated: 04-Dec-14 20:14 by CaLujean Amel (MD)

## 2014-08-17 NOTE — Consult Note (Signed)
PATIENT NAME:  Ian CroftBOWMAN, Sameul R MR#:  [id number] DATE OF BIRTH:  1933-04-01  DATE OF CONSULTATION:  03/31/2013  REFERRING PHYSICIAN:   CONSULTING PHYSICIAN:  Adah Salvageichard E. Excell Seltzerooper, MD  CHIEF COMPLAINT:  Perforated diverticulitis identified on CT scan.   HISTORY OF PRESENT ILLNESS:  The patient denies any abdominal pain. I was asked to see the patient for a CT findings suggestive of contained perforation of acute diverticulitis. The patient at this time denies any abdominal pain. He states he has never had abdominal surgery before and is presently in the Intensive Care Unit. He denies nausea or vomiting.   Also of note, the patient has been in the hospital for several days. He was found down at his home and a DVT was diagnosed. He now has a hematoma of his thigh and is scheduled for a [place] filter to be placed for prophylaxis from pulmonary embolus.   PAST MEDICAL HISTORY: 1.  Stroke.  2.  Hypertension.  3.  Hypercholesterolemia.  4.  Benign prostatic hypertrophy.  5.  Gout.   PAST SURGICAL HISTORY:  The patient denies any abdominal surgery.   ALLERGIES:  None.   MEDICATIONS:  Multiple, see chart.   FAMILY HISTORY:  Noncontributory.   SOCIAL HISTORY:  The patient is an ex-smoker and a retired [employer].   REVIEW OF SYSTEMS:  A 10-system review is performed with some difficulty, and negative other than that mentioned in the HPI .   PHYSICAL EXAMINATION:  GENERAL:  Chronically ill-appearing, elderly male patient. He appears quite comfortable.  HEENT:  Shows no scleral icterus.  NECK:  No palpable neck nodes.  CHEST:  Clear to auscultation with some mild bilateral rhonchi.  CARDIAC:  Regular rate and rhythm.  ABDOMEN:  Soft, minimally tender in the left lower quadrant. No rebound, no percussion tenderness. No guarding.  EXTREMITIES:  Show moderate edema and marked swelling of the thigh.  NEUROLOGIC:  Grossly intact.  INTEGUMENT:  No jaundice. VITAL SIGNS:  Within normal limits at  this time.   LABORATORY, DIAGNOSTIC, AND RADIOLOGICAL DATA:  White blood cell count is 15.7, H and H of 8 and 24 with a platelet count of 81.   ASSESSMENT AND PLAN:  This is a patient with perforated diverticulitis. A CT scan has been personally reviewed showing micro-perforation or contained perforation in the area of the sigmoid colon. I have recommended continuing IV antibiotics. I will be happy to follow the patient while he is in the hospital and hopefully this critically ill patient with multiple medical problems will not require surgery due to worsening of this condition.   ____________________________ Adah Salvageichard E. Excell Seltzerooper, MD rec:jm D: 03/31/2013 15:59:08 ET T: 03/31/2013 16:36:26 ET JOB#: [id number]  cc: Adah Salvageichard E. Excell Seltzerooper, MD, <Dictator> Lattie HawICHARD E Robert Sperl MD ELECTRONICALLY SIGNED 03/31/2013 16:51

## 2014-08-17 NOTE — Discharge Summary (Signed)
Dates of Admission and Diagnosis:  Date of Admission 06-Apr-2013   Date of Discharge 17-Apr-2013   Admitting Diagnosis Sepsis, Diverticulitis with two diverticular abscesses, acute renal failure   Final Diagnosis Sepsis, Diverticulitis with two diverticular abscesses, Paroxysmal atrial fibrillation   Discharge Diagnosis 1 Sepsis   2 Diverticulitis with two diverticular abscesses   3 Paroxysmal atrial fibrillation   4 Hypertension   5 Left popliteal DVT, s/p IVC filter   6 Right thigh hematoma    Chief Complaint/History of Present Illness Patient was admitted with altered mental status and sepsis secondary to sigmoid diverticulitis with two abscess in the adjoining area approximately 3 cm diameter. Patient was hypotensive on presentation, requiring vasopressors. He was admitted to CCU as limited code.   Routine Chem:  11-Dec-14 15:42   Creatinine (comp)  2.06  12-Dec-14 02:27   Creatinine (comp)  2.48  13-Dec-14 09:09   Creatinine (comp)  2.58  14-Dec-14 00:35   Creatinine (comp)  2.50  15-Dec-14 03:21   Creatinine (comp)  1.89  16-Dec-14 02:10   Creatinine (comp)  1.47  17-Dec-14 04:25   Creatinine (comp) 1.10  18-Dec-14 03:57   Creatinine (comp) 0.98  19-Dec-14 09:28   Creatinine (comp) 0.99  20-Dec-14 03:49   Creatinine (comp) 1.00  21-Dec-14 03:06   Creatinine (comp)  1.32  Routine Hem:  11-Dec-14 15:42   WBC (CBC)  26.4  12-Dec-14 02:27   WBC (CBC)  31.8  13-Dec-14 09:09   WBC (CBC)  33.0  14-Dec-14 00:35   WBC (CBC)  24.7  15-Dec-14 03:21   WBC (CBC)  12.4  16-Dec-14 02:10   WBC (CBC)  11.0  17-Dec-14 04:25   WBC (CBC)  11.0  18-Dec-14 03:57   WBC (CBC) 9.6  19-Dec-14 09:28   WBC (CBC)  12.7  20-Dec-14 03:49   WBC (CBC)  11.5  21-Dec-14 03:06   WBC (CBC)  21.9   PERTINENT RADIOLOGY STUDIES: XRay:    11-Dec-14 15:58, Chest Portable Single View  Chest Portable Single View   REASON FOR EXAM:    Altered Mental Status  COMMENTS:        PROCEDURE: DXR - DXR PORTABLE CHEST SINGLE VIEW  - Apr 06 2013  3:58PM     CLINICAL DATA:  Shortness of breath.    EXAM:  PORTABLE CHEST - 1 VIEW    COMPARISON:  03/27/2013    FINDINGS:  Low lung volumes. Mild areas of increased density projects in the  lung bases. Cardiac silhouette is enlarged. Aorta is tortuous and  ectatic. Osseous structures unremarkable.     IMPRESSION:  Mild atelectasis versus mild bibasilar infiltrates. Repeat PA and  lateral views age recommended.      Electronically Signed    By: Salome Holmes M.D.    On: 04/06/2013 16:07         Verified By: Jani Files, M.D., MD    16-Dec-14 12:08, Chest Portable Single View  Chest Portable Single View   REASON FOR EXAM:    PICC Line Placement  COMMENTS:       PROCEDURE: DXR - DXR PORTABLE CHEST SINGLE VIEW  - Apr 11 2013 12:08PM     CLINICAL DATA:  PICC line placement.    EXAM:  PORTABLE CHEST - 1 VIEW    COMPARISON:  04/06/2013    FINDINGS:  There has been interval placement of a right PICC line with tip  overlying the lower SVC. The cardiac silhouette remains enlarged.  The lungs  remain hypoinflated. There is improved aeration of the  right lung base. Mild left basilar opacity remains, likely  reflecting atelectasis. No pneumothorax is identified.     IMPRESSION:  Interval right PICC line placement as above.      Electronically Signed    By: Sebastian AcheAllen  Grady    On: 04/11/2013 12:10         Verified By: Prudy FeelerALLEN T. GRADY, M.D.,  LabUnknown:    11-Dec-14 16:26, CT Head Without Contrast  PACS Image   CT:    11-Dec-14 19:01, CT Abdomen and Pelvis Without Contrast  CT Abdomen and Pelvis Without Contrast   REASON FOR EXAM:    (1) hx of perf diverticulitis, now with ams; (2) hx   of perf diverticulitis, now  COMMENTS:       PROCEDURE: CT  - CT ABDOMEN AND PELVIS W0  - Apr 06 2013  7:01PM     CLINICAL DATA:  Abdominal pain, shortness of breath, recent history  of perforated  diverticulitis    EXAM:  CT ABDOMEN AND PELVIS WITHOUT CONTRAST    TECHNIQUE:  Multidetector CT imaging of the abdomen and pelvis was performed  following the standard protocol without intravenous contrast.  COMPARISON:  03/31/2013    FINDINGS:  Small bilateral pleural effusions. Associated lower lobe  atelectasis.    Small pericardial effusions. Lipomatous hypertrophy of the  intra-atrial septum.    Unenhanced liver, spleen, pancreas, adrenal glands are within normal  limits.    Gallbladder is notable for layering sludge/small gallstones (series  3/image 28). No associated inflammatory changes. No intrahepatic or  extrahepatic ductal dilatation.  Five right and three left nonobstructing renal calculi, measuring up  to 8 mm (series 3/ image 41). No hydronephrosis.    No evidence of bowel obstruction.  Normal appendix.    2.7 x 3.0 cm fluid and gas collection adjacent to the proximal  sigmoid colon (series 3/image 79), suspicious for early/developing  abscess related tosigmoid diverticulitis (series 3/image 39).  Additional 2.9 x 2.1 cm gas and fluid collection anterior to the  sigmoid colon (series 3/image 75), although without defined rim,  possibly reflecting a dominant diverticulum versus additional  pericolonic fluid collection/abscess. Again noted is localized gas  within the anterior abdominal mesentery (series 3/image 70),  decreased.  Atherosclerotic calcifications of the abdominal aorta and branch  vessels. IVC filter.    No abdominopelvic ascites.    No suspicious abdominopelvic lymphadenopathy.    Prostate is unremarkable.    Bladder is decompressed by indwelling Foley catheter.    Mild asymmetric enlargement of the anterior right thigh musculature  (series 3/image 106), intramuscular hemorrhagenot excluded,  unchanged.   IMPRESSION:  2.7 x 3.1 cm early/developing abscess along the proximal sigmoid  colon, likely related to sigmoid  diverticulitis.    2.9 x 2.1 cm gas and fluid collection anterior to the sigmoid colon,  possibly reflecting additional pericolonic fluid collection/abscess  versus dominant diverticulum.    Additional localized gas within the anterior abdominal mesenteric,  decreased.    Additional stable ancillary findings as above.      Electronically Signed    By: Charline BillsSriyesh Krishnan M.D.    On: 04/06/2013 19:31         Verified By: Charline BillsSRIYESH . KRISHNAN, M.D.,   Hospital Course:  Hospital Course Patient received i.v antibiotics, vasopressors and i.v fluids in CCU. He recived i.v Zosyn and Vancomycin. WBC count and renal function improved gradually. Blood c/s and urine c/s were negative. He was  gradually weaned off vasopressors. ID was consulted. Antibiotics were switched to Meropenem. PICC line was inserted for extended course of antibiotics. Patient went into atrial fibrillation with controlled ventricular response rate while he was in CCU. He did not receive anti-coagulation due to high risk of bleeding. h/o recent anemia and right thigh hematoma while on i.v heparin for DVT. Patient tolerated clear liquid diet. Renal function improved gradually. He remained hemodynamically stable. Mental status improved and he was transferred to the floor. Patient remained on i.v Meropenem. Lasix was started for peripheral edema. Over the weekend the patient's overall condition deteriorated. Morphine was increased for pain control. Palliative care was consulted. Family decided to make him DNR. Comfort care only. Patient was discharged to Hospice. Condition is guarded. Poor is prognosis.   Condition on Discharge Guarded   DISCHARGE INSTRUCTIONS HOME MEDS:  Medication Reconciliation: Patient's Home Medications at Discharge:     Medication Instructions  nystatin topical 100000 units/g topical powder  Apply topically to groin and scrotum 2 times a day for rash    lorazepam 0.5 mg oral tablet  1-2 tab(s)  orally/sublingual every 2 to 4 hours as needed for agitation/anxiety   morphine 20 mg/ml oral concentrate  0.5 milliliter(s) orally every 1 to 2 hours   zofran odt 4 mg oral tablet, disintegrating  1 tab(s) orally every 6 hours, As Needed- for Nausea, Vomiting      Physician's Instructions:  Diet clear liquid diet   Activity Limitations As tolerated   Return to Work Not Applicable   Time frame for Follow Up Appointment 4-6 weeks   Electronic Signatures: Leotis Shames (MD)  (Signed 22-Dec-14 14:43)  Authored: ADMISSION DATE AND DIAGNOSIS, CHIEF COMPLAINT/HPI, PERTINENT LABS, PERTINENT RADIOLOGY STUDIES, HOSPITAL COURSE, DISCHARGE INSTRUCTIONS HOME MEDS, PATIENT INSTRUCTIONS   Last Updated: 22-Dec-14 14:43 by Leotis Shames (MD)

## 2014-08-17 NOTE — Consult Note (Signed)
PATIENT NAME:  Ian CroftBOWMAN, Mayford R MR#:  [id number] DATE OF BIRTH:  02/16/1933  INFECTIOUS DISEASE CONSULT NOTE  DATE OF CONSULTATION:  04/10/2013  REFERRING PHYSICIAN:  Leotis ShamesJasmine Singh, MD CONSULTING PHYSICIAN:  Stann Mainlandavid P. Sampson GoonFitzgerald, MD  REASON FOR CONSULTATION: Sepsis and diverticular abscess.   HISTORY OF PRESENT ILLNESS: This is a very pleasant but unfortunate 81 year old gentleman with a new diagnosis of temporal arteritis since October of this year. He was treated with high-dose IV steroids and then chronic use of prednisone. He developed DVT, hypercalcemia, dehydration, and acute renal failure in early December. He was anticoagulated for the DVT but then developed acute blood loss anemia and had a hematoma on his right thigh. He had an IVC filter placed. He then had some abdominal symptoms and was found to have acute diverticulitis with a small contained perforation in the sigmoid colon. It was treated initially with IV antibiotics and had clinical improvement.   He was discharged to a skilled nursing facility on levofloxacin and Flagyl; however, only a few days later, he came back with altered mental status, hypotension, and leukocytosis at 33.   Repeat CT scan showed  2 abscesses noted and he was seen by surgery who felt he was a poor operative candidate. He was started on Zosyn and vancomycin. His white blood count has improved and he has hemodynamically become more stable. He has had no fevers. We are consulted for further recommendations.   PAST MEDICAL HISTORY:  1.  Temporal arteritis as above with blindness.  2.  Left popliteal DVT complicated by right thigh hematoma status post IVC filter.  3.  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  4.  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  5.  Hypertension.  6.  Hyperlipidemia.  7.  BPH. 8.  Gout.  9.  Prior coronary artery disease.   SOCIAL HISTORY: The patient does not smoke or drink. He was admitted from a  skilled nursing facility.   FAMILY HISTORY: Positive for stroke, diabetes and hypertension.   ALLERGIES: No known drug allergies   REVIEW OF SYSTEMS: Unable to be obtained due to confusion and altered mental status.   MEDICATIONS: Current antibiotics include vancomycin and Zosyn begun on admission.   OTHER MEDICATIONS: Include methylprednisolone 60 mg IV once a day, morphine, Zofra ,pantoprazole.   PHYSICAL EXAMINATION:  VITAL SIGNS: Temperature 98.7, pulse 84 to 94, blood pressure 127/73, sats 100% on 2 liters, respirations 14.  GENERAL: Obese, lying in bed, very quietly able to answer yes or no questions. He denies active pain  OROPHARYNX: Clear.  NECK: Supple.  HEART: Regular.  LUNGS: Bibasilar crackles.  ABDOMEN: Obese, distended, soft. No evidence of tenderness except at deep palpation in the left lower quadrant.  EXTREMITIES: He has 3+ bilateral lower extremity edema.  NEUROLOGIC: Awake but lethargic and able to move all 4 extremities.   LABORATORY, DIAGNOSTIC AND RADIOLOGICAL DATA: White blood count on admission December 12 was 31.8. It is currently 12.4. Hemoglobin 11.1, platelets 58. LFTs are relatively normal except albumin 2.5. Renal function shows a creatinine of 1.89 with an estimated GFR of 33. His creatinine on admission was 2.48. Blood cultures x2 done on admission December 11 are negative. Urine culture December 11 is  also negative.   CT scan of the abdomen done December 11 and compared with December 5 reveals a 2.7 x 3.1 cm early developing abscess along the proximal sigmoid colon likely related to sigmoid diverticulitis. There is also a 2.9  x 2.1 cm gas and fluid collection anterior to the sigmoid colon, possibly reflecting additional pericolonic fluid collection abscesses versus dominant diverticulum. There is also additional localized gas within the anterior abdominal mesenteric. This was decreased. He also has bilateral nephrolithiasis, small gallstones, and a possible  intramuscular hemorrhage in his anterior right thigh musculature.   IMPRESSION: A 81 year old with a complicated history including temporal arteritis with chronic steroid use over the last 3 months. He has had complications of a deep vein thrombosis in his left lower extremity, acute renal failure, and now recurrent diverticulitis failing outpatient management. Blood cultures are negative. White count has responded and he is more hemodynamically stable. The most likely pathogens would be intra-abdominal anaerobes and Escherichia coli and other gram negatives.   Given the renal failure, I think it would be good to discontinue the vancomycin as no resistant organisms have been isolated in blood cultures. Surgery has seen him and feels he is not a good candidate for surgical intervention.   RECOMMENDATIONS:  1.  At this point, I would recommend consolidating antibiotics from Zosyn and vancomycin to meropenem 1 g q.12 hours based on his renal function. This will allow easier dosing and give broad coverage.  2.  He will likely need a PICC line with 1 to 2 weeks of IV antibiotics followed by oral antibiotics until clinical and radiological resolution of his abscess.  3.  If he worsens, we can consider discussing with surgery placement of a percutaneous drain, although this may lead to a colonic cutaneous fistula, which would not be a good outcome in this patient on prolonged high-dose steroids.   Thank you for the consult. I would be glad to follow with you.    ____________________________ Stann Mainland. Sampson Goon, MD dpf:np D: 04/10/2013 19:08:44 ET T: 04/10/2013 19:29:55 ET JOB#: [id number]  cc: Stann Mainland. Sampson Goon, MD, <Dictator> Kalil Woessner Sampson Goon MD ELECTRONICALLY SIGNED 04/15/2013 20:54

## 2014-08-18 NOTE — Consult Note (Signed)
PATIENT NAME:  Ian Avery, Ian Avery MR#:  [id number] DATE OF BIRTH:  1932/07/08  DATE OF CONSULTATION:  03/27/2013  ATTENDING PHYSICIAN:  Dr. [facility].  REFERRING PHYSICIAN: Dr. Mordecai Maes.  CONSULTING PHYSICIAN: Dwayne D. Juliann Pares, M.D.   INDICATION: Atrial fibrillation, atrial flutter, altered mental status.   HISTORY OF PRESENT ILLNESS: The patient is a 13 white male with previous history of stroke, hypertension, hyperlipidemia, temporal arteritis with blindness, gout, history of possible NSTEMI in the past, obesity. He came in with recent falls and weakness. The patient evidently  has been falling over the last week with weakness several times at home and again last night. He was found down by his family and apparently had been down for quite some time.  He did not make it to the bed. The sheets were still perfectly placed on the bed like he had not slept on the back at all. He was on the floor confused. EMS was called and he was brought to the Emergency Room for evaluation. He was found to be in rapid atrial fibrillation with history of a stroke in the past. History of temporal arteritis.  He had been on steroids. He had had mild depression. He had been given significant high-level steroids with no improvement in symptoms. He was usually going to see somebody in [place] and now switched to Dr. [name] here at [facility] clinic.  Because of his weakness, recurrent falls and tachycardia with atrial fibrillation a Cardiology consultation was recommended.   REVIEW OF SYSTEMS:  He has had multiple falls. No fever. No chills. No sweats. No weight loss. No weight gain. Vision: He is essentially legally blind secondary to temporal arteritis.  No tenderness. No sinus pain. No cough. No wheezing. No sputum production. No history of COPD.  Heart is tachycardia, but denies palpitations. He has had some lower extremity edema. No real chest pain. No worsening shortness of breath. He has had no dysuria,  hematuria, or frequency. No polyuria.  No polydipsia or polyphagia. No recent history of anemia. No rashes on the skin.  He has had generalized weakness of his arms with mild leg edema. Neurologically, he has complained of no significant numbness. He has been tired and fatigued with some vertigo and recent confusion.  Otherwise negative.   PAST MEDICAL HISTORY: CVA, hypertension, hypercholesterolemia, BPH, temporal arteritis, gout, possible non-Q-wave MI in the past, obesity, blindness.   ALLERGIES: None.   SOCIAL HISTORY: Lives alone. Quit smoking at 89. Smoked for over 30 years. No alcohol abuse. Daughter has power of attorney.   FAMILY HISTORY: CVA, diabetes, hypertension, myocardial infarction.   MEDICATIONS: He was on vitamin D, metoprolol 25 a day, Flomax 0.4 a day, ramipril 20 mg once a day, prednisone 60 mg a day, potassium chloride 10 mEq daily, Lipitor 40 a day, Lasix 40 a day, finasteride 5 mg daily, citalopram 10 mg daily, aspirin 81 mg a day, amlodipine 5 mg a day.   PHYSICAL EXAMINATION: VITAL SIGNS: Blood pressure 120/80, pulse 85 and irregular, respiratory rate 16, afebrile.  HEENT: Normocephalic except for blindness.  NECK: Supple. No significant JVD, bruits, or adenopathy.  LUNGS:   clear with mild rhonchi and no wheezing. No rales.  HEART: Irregularly irregular. Systolic ejection murmur left sternal border. PMI nondisplaced. Positive S4.  ABDOMEN: Benign. Positive bowel sounds. No rebound, guarding, tenderness or tenderness.  EXTREMITIES: 1 to 2+ edema.  NEUROLOGIC: Grossly intact.  SKIN: Normal. PSYCHIATRIC: Mildly confused.  LABORATORY, DIAGNOSTIC, AND RADIOLOGICAL DATA: EKG: Atrial fibrillation,  possible atrial flutter, nonspecific ST-T wave changes, rate at the time of the EKG was 110. CT of the head: No acute stroke. CT of the neck and spinal column: No acute findings. Chest x-ray negative. Ultrasound of the lower extremities shows DVTs in the popliteal vein.  BNP was  2000. BUN 53, creatinine 2.54, glucose 77, calcium 11.4, sodium 138, potassium 4.8. LFTs basically unremarkable. Troponin slightly elevated at 0.29. White count of 26,000, hemoglobin 14, platelet count 91. Urinalysis 16, white cells, 64, red positive for 14, total CK 205.   ASSESSMENT: 1.  Acute renal insufficiency.  2.  Hypertension.  3.  Atrial fibrillation. 4.  Hypercholesterolemia. 5.  Obesity. 6.  Slightly elevated troponin.  7.  Gout. 8.  History of temporal arteritis. 9.  Depression. 10.  Dehydration.  11.  History of deep venous thrombosis.   PLAN:  1.  Agree with admit. Place on anticoagulation with heparin for now. Consider switching to long-term anticoagulation to continue rate control for atrial fibrillation.  He may need long-term anticoagulation with an elevated [place] score. We need to assess his risks for further falls.  2. Follow-up cardiac enzymes, troponin and EKGs.  3.  Renal insufficiency. Recommend mild hydration with his elevated BUN and creatinine.  There is concern that he is dehydrated with acute on chronic renal insufficiency.  4.  Possibly treat for urinary tract infection.  5.  Consider inflammatory response. He has had elevated white count, but has been on steroids for quite some time.  6.  Continue treatment for hyperkalemia.  7.  Coronary artery disease. Continue aspirin therapy. Do not recommend cardiac catheterization at this point with his renal insufficiency and no chest pain symptoms.  8.  Temporal arteritis. Continue prednisone therapy for blindness. Again, may be secondary to temporal arteritis and continue steroids.  9.  Continue deep vein thrombosis prophylaxis with heparin. Consider switching to a high level anticoagulation provided he is not at worsened risks from falls.  10.  Recommend GI prophylaxis with multiple medications that would put him at risk if GI bleeding.  Would treat the patient medically for now. Echocardiogram will be helpful. Do not  recommend cardiac catheterization I am not sure that I recommend a functional study at this point either. I will continue to follow you ,control rate and consider anticoagulation. Do not recommend high-grade or high-level antiarrhythmics at this point as well.   ____________________________ Bobbie Stackwayne D. Juliann Paresallwood, MD ddc:dp D: 03/28/2013 12:36:00 ET T: 03/28/2013 13:02:06 ET JOB#: [id number]  cc: Dwayne D. Juliann Paresallwood, MD, <Dictator> Alwyn PeaWAYNE D CALLWOOD MD ELECTRONICALLY SIGNED 05/08/2013 11:56

## 2014-08-18 NOTE — Op Note (Signed)
PATIENT NAME:  Ian Avery, Ian Avery MR#:  [id number] DATE OF BIRTH:  July 29, 1932  DATE OF PROCEDURE:  04/12/2013  PREOPERATIVE DIAGNOSES:   1. Intra-abdominal abscess.  2. Diverticulitis.  3. Steroid dependence.  4. Acute renal failure.   POSTOPERATIVE DIAGNOSES:  1. Intra-abdominal abscess.  2. Diverticulitis.  3. Steroid dependence.  4. Acute renal failure.   PROCEDURES:  1. Ultrasound guidance for vascular access to the right brachial vein.  2. Fluoroscopic guidance for placement of catheter.  3. Insertion of peripherally inserted central venous catheter, right arm.  SURGEON: Renford DillsGregory G. Josafat Enrico, M.D.   ANESTHESIA: Local.   ESTIMATED BLOOD LOSS: Minimal.   INDICATION FOR PROCEDURE: Requiring IV antibiotics greater than 5 days.   DESCRIPTION OF PROCEDURE: The patient's right arm was sterilely prepped and draped, and a sterile surgical field was created. The brachial vein was accessed under direct ultrasound guidance without difficulty with a micropuncture needle and permanent image was recorded. 0.018 wire was then placed into the superior vena cava. Peel-away sheath was placed over the wire. A single lumen peripherally inserted central venous catheter was then placed over the wire and the wire and peel-away sheath were removed. The catheter tip was placed into the superior vena cava and was secured at the skin at 37 cm with a sterile dressing. The catheter withdrew blood well and flushed easily with heparinized saline. The patient tolerated procedure well.  ____________________________ Renford DillsGregory G. Ozie Dimaria, MD ggs:gb D: 04/12/2013 17:23:15 ET T: 04/12/2013 21:25:58 ET JOB#: [id number]  cc: Renford DillsGregory G. Masha Orbach, MD, <Dictator> Renford DillsGREGORY G Aubrianna Orchard MD ELECTRONICALLY SIGNED 05/02/2013 19:29

## 2014-08-19 NOTE — Consult Note (Signed)
PATIENT NAME:  Ian Avery, Overton R MR#:  [id number] DATE OF BIRTH:  [birth year]  DATE OF CONSULTATION:  09/03/2011  REFERRING PHYSICIAN:  Dr. Rudene Rearwish  CONSULTING PHYSICIAN:  Rose PhiPeter R. Kemper Durielarke, MD  HISTORY OF PRESENT ILLNESS: Mr. Ian Avery is a 81 year old right-handed widowed white semiretired Architectautomobile mechanic, patient of Dr. Jola BabinskiWalter Fowler of [facility] with history of cigar and pipe tobacco use, chronic hypertension, hyperlipidemia, moderate obesity, family history of stroke (parents), and [facility] for right pons non-hemorrhagic stroke with eventual clearing of slurred speech and left side weakness. He was admitted early 09/03/2011 and is referred for evaluation of TIA. History comes from the patient who is accompanied by his daughter and from his hospital chart and [facility].   The patient was brought to the Emergency Room at approximately 9:50 p.m. on 09/02/2011 by his daughter with concern regarding possible stroke. She reports that when she came to see him that evening, he sat with shoulders very high and appeared somewhat warm. She checked his temperature and it was 100 degrees. He reported bad neck pain for three days. She had him try to lower his shoulders; this resulted in some mild decrease of neck discomfort. Then as they talked, he had the onset of difficulty speaking, getting out what he wanted to say. He did not have slurring of speech, facial droop, numbness of face or limbs, or weakness of arms or legs.   On arrival in the Emergency Room, blood pressure was 185/93 with heart rate 88, respirations 20, oxygen saturation 96%. While in the Emergency Room, difficulty with speech cleared, after a total of approximately 30 minutes. However, his daughter reports noting a little difficulty in his finding of words today. He received Norco at 4 a.m. and Percocet at 6 a.m. this morning for neck pain. He  reports that his neck discomfort is much decreased, reduced to about 35% of the intense pain that he had had on May 8th.   He reports that he awoke the morning of May 6th with pain across the back of the neck, wondered if he had slept wrong. His pain persisted and worsened on the 7th and more so on the 8th. He recalls no precipitating events. He in the past had had some mild neck discomfort at times but never really pains in the neck or clicks in the neck.   PHYSICAL EXAMINATION: The patient is a well developed, mildly overweight white gentleman examined initially lying semisupine, in no apparent distress. Blood pressure 160/100, heart rate 68. There was no fever. He was normocephalic without evidence of trauma and notable for crowded posterior pharyngeal space. His neck was supple with report of mild posterior low neck pain with forward flexion. He reported more significant pain with extension and with rotation and bending to each side. These movements showed very significant decrease of range of motion.   His mental status was normal with the exception that he was a little slow at times to respond to questions, possibly with a little difficulty finding words. He was alert and was fully oriented with clear speech. He was lucid and a good historian with normal affect. Cranial nerve examination was normal including facial appearance at rest and with directed movements in conversation, eye movements, visual field to finger count for each eye. Motor examination of the extremities showed normal tone and bulk throughout and normal power throughout. Extremity coordination was normal. Reflexes were symmetric.  His gait was not tested.   IMPRESSION:  1. History of February 2011 right pontine stroke with left side weakness and slurred speech, resolved.  2. Recent onset of significant posterior neck pain decreased with narcotic analgesics in the hospital, found associated with significant decrease of range of motion  on examination these movements giving significant pain.  3. Episode of expressive aphasia lasting approximately 30 minutes the evening of 09/02/2011, and nearly fully resolved, suspected precipitated by significant increase of blood pressure associated with severe neck pain.  4. The patient endorses snoring and his daughter reports knowledge of his snoring. With his body habitus, I suspect that he may have some significant obstructive sleep apnea.   RECOMMENDATIONS:  1. He will be scheduled for brain MRI scan. I suspect that this will show a small area of altered signal intensity on diffusion weighted imaging consistent with recent infarction on the left in the subcortical basal ganglia area, residua from his event the evening of the 8th.   2. He will be scheduled for MRI of the cervical spine to evaluate arthritic changes; this will include contrast to better rule out possibility of epidural abscess.  3. He will be given a trial of bolster cervical pillow for optimizing relaxation of neck and shoulder muscle areas during sleep.  4. Flexeril 5 mg 4 times a day as muscle relaxant.  5. Referral to physical therapy for instruction in daily exercises to stretch and relax muscles of the neck and shoulders.  6. He will be started on a trial of CPAP in hospital.  7. It is recommended that he have outpatient overnight sleep study to evaluate suspected sleep apnea.  8. I agree with having him continue on Aggrenox twice a day.  9. The patient is advised to obtain a device of checking blood pressure and heart rate at home and he is advised to be in contact with Dr. Ether Griffins about what values the patient should be in contact with Dr. Ether Griffins about.   I appreciate being asked to see this pleasant and interesting gentleman. I will plan to follow-up on results of his MRI scan of the brain and of the cervical spine.  ____________________________ Rose Phi. Kemper Durie, MD prc:drc D: 09/03/2011 20:41:09 ET T: 09/04/2011  07:41:37 ET JOB#: [id number]  cc: Rose Phi. Kemper Durie, MD, <Dictator> Dr. Jola Babinski, [facility]  Gaspar Garbe MD ELECTRONICALLY SIGNED 09/04/2011 11:25

## 2014-08-19 NOTE — Discharge Summary (Signed)
PATIENT NAME:  Ian Avery, Ian Avery MR#:  [id number] DATE OF BIRTH:  [birth year]  DATE OF ADMISSION:  09/03/2011 DATE OF DISCHARGE:  09/04/2011  DISCHARGE DIAGNOSES: 1. Acute cerebrovascular accident in left thalamic area and cervical pain due to degenerative joint disease. 2. Hypertension. 3. History of cerebrovascular accident.  4. Possible sleep apnea.   DISCHARGE MEDICATIONS:  1. Aggrenox 25/200 mg p.o. b.i.d.  2. Lovastatin 40 mg p.o. daily.  3. Flomax 0.4 mg 2 capsules once a day.  4. Finasteride 5 mg p.o. daily.   NEW MEDICATIONS:  1. Flexeril 5 mg every six hours as needed for neck pain.  2. Ramipril 10 mg daily. 3. Amlodipine 10 mg daily.  4. Hydralazine 50 mg p.o. t.i.d.   FOLLOW-UP: Follow-up with Dr. Suzan SlickPeter Clarke. Also, the patient's primary doctor is from out of area. He needs to follow-up with them for possible sleep study. Also, the patient is given  outpatient physical therapy and speech therapy appointments.   LABORATORY, DIAGNOSTIC AND RADIOLOGICAL DATA: LDL 94, HDL 55. Troponin less than 0.02. WBC 30 on admission, hemoglobin 14.8, hematocrit 34, platelets 149. Electrolytes: Sodium 141, potassium 3.4, chloride 106, bicarbonate 24, BUN 12, creatinine 1.13. The patient's echocardiogram showed ejection fraction of more than 55%, normal systolic function. CT of the head on admission showed a subacute infarct in the left thalamic area. Cannot be excluded. No hemorrhage. Carotid ultrasound showed atherosclerosis without evidence of hemodynamically significant stenosis. MRI of the brain showed acute infarct in the left thalamus.   CONSULTATION: Dr. Suzan SlickPeter Clarke, neurology.   HOSPITAL COURSE:  401. 81 year old male with hypertension, history of stroke, who came in because of trouble speaking. Please see the history and physical for complete details. The [place] in [place]. The patient brought in by family because he was noted to have trouble speaking with  difficulty forming meaningful sentences. Unable to get words out and the patient has been having neck pain. The patient had a history of stroke and had weakness on one side at that time, but this event only had speech trouble. Look under the history and physical for full details. He is admitted for possible acute stroke and started on Plavix along with other medications and his blood pressure was evaluated to 181/95 on admission and Ramipril dose has increased from 5 to 10 mg and he is admitted to telemetry. The patient is also seen by Dr. Kemper Durielarke and suggested that his neck pain is the culprit for his blood pressure to go up and causing the possibility of stroke. The patient had an MRI of the C-spine which showed multilevel degenerative disk disease and multiple bilateral neural foraminal narrowing. The patient recommended to have physical therapy and neck exercises for his neck pain and Flexeril also started as a muscle relaxant for his neck pain and has a cervical pillow to relax his neck and he said Flexeril helping and also physical therapy is working and he has a physical therapy appointment outside for his neck. Regarding his speech, his speech therapist, Jerilynn SomKatherine Watson, has seen the patient and recommended outpatient speech therapy. The patient noted to have  speaking  and understanding  deficit during the conversation with the daughter and we have arranged outpatient speech therapy evaluation and physical therapy for his neck.  2. Uncontrolled hypertension. The patient's blood pressure was elevated on admission and his blood pressure was 172/104 and that improved to 138/87, so his medications are adjusted. Ramipril is increased to 10 mg  daily and Amlodipine increased to 10 mg daily and hydralazine is added to his blood pressure medications.  3. The patient also has an appointment for possible sleep apnea and sleep study and he also had a history of snoring and the patient is given a trial of CPAP. He  said it helped him some so he needs to have outpatient sleep study.   TOTAL TIME SPENT ON DISCHARGE PREPARATION: More than 30 minutes.   ____________________________ Katha Hamming, MD sk:ap D: 09/05/2011 22:04:08 ET            T: 09/08/2011 10:57:19 ET              JOB#: [id number] cc: Katha Hamming, MD, <Dictator> Rose Phi. Kemper Durie, MD Katha Hamming MD ELECTRONICALLY SIGNED 09/09/2011 23:28

## 2014-08-19 NOTE — H&P (Signed)
PATIENT NAME:  Ian Avery, WIDRIG MR#:  [id number] DATE OF BIRTH:  [birth year]  DATE OF ADMISSION:  09/03/2011  PRIMARY CARE PHYSICIAN: He has no local doctor, he goes to [facility].   CHIEF COMPLAINT: Difficulty speaking.   HISTORY OF PRESENT ILLNESS: Mr. Sayegh is a 81 year old Caucasian male with history of systemic hypertension, hypercholesterolemia and prior stroke in 2011. The patient was in his usual state of health. He states that for the last few days he has stiff neck, however, the family noticed around 8:00 p.m. last night that he has a problem with speech. He cannot form meaningful statement. He was attempting to make sentences then he gets frustrated. His daughter stated that he could not communicate effectively with them and they know that this is his situation in 2011 when he had stroke but at that time he had one-sided weakness associated with facial droop, however, there was no focal weakness during this event this time. The family decided to bring him to the Emergency Department. By the time he came here his symptoms resolved. The whole episode took about 30 minutes and that was about eight hours ago.    REVIEW OF SYSTEMS: CONSTITUTIONAL: Denies any fever. No chills. No fatigue. EYES: No blurring of vision. No double vision. ENT: No hearing impairment. No sore throat. No dysphagia. CARDIOVASCULAR: No chest pain. No shortness of breath. No syncope. RESPIRATORY: No cough. No sputum production. No chest pain. No shortness of breath. GASTROINTESTINAL: No abdominal pain. No vomiting. No diarrhea. GENITOURINARY: No dysuria. No frequency of urination. MUSCULOSKELETAL: No joint pain or swelling. No muscular pain or swelling. INTEGUMENTARY: No skin rash. No ulcers. NEUROLOGY: No focal weakness. No seizure activity. No headache. He had problem with speech earlier but that resolved. PSYCHIATRY: No anxiety. No depression. ENDOCRINE: No polyuria or polydipsia. No heat or cold intolerance.   PAST MEDICAL  HISTORY:  1. History of stroke in 2011, at that time affected the left side, but he had complete resolution.  2. Systemic hypertension.  3. Hypercholesterolemia.  4. Prostatic problem.   SOCIAL HABITS: Patient is nonsmoker. He quit in 1991. No history of alcohol or drug abuse.   FAMILY HISTORY: Both parents suffered from strokes. His father had diabetes mellitus. His mother suffered from hypertension.   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 Lives at home alone.   ADMISSION MEDICATIONS:  1. Ramipril 5 mg once a day. 2. Lovastatin 40 mg a day. 3. Amlodipine 5 mg a day. 4. Aggrenox 1 capsule twice a day. 5. Finasteride 5 mg once a day. 6. Flomax 0.4 mg once a day.   ALLERGIES: No known drug allergies.   PHYSICAL EXAMINATION:  VITAL SIGNS: Blood pressure 181/95, respiratory rate 20, pulse 79, temperature 98.2, oxygen saturation 97%.   GENERAL APPEARANCE: Elderly male laying in bed, obese, in no acute distress.   HEAD AND NECK EXAMINATION: No pallor. No icterus. No cyanosis.   ENT: Hearing was normal. Nasal mucosa, lips, tongue were normal.   EYES: Normal eyelids and conjunctiva. Pupils about 4 to 5 mm, equal, sluggishly reactive to light.   NECK: Supple. Trachea at midline. No thyromegaly. No cervical lymphadenopathy. No masses.   HEART: Normal S1, S2. No S3, S4. No murmur. No gallop. No carotid bruits.   RESPIRATORY: Normal breathing pattern without use of accessory muscles. No rales. No wheezing.   ABDOMEN: Soft, obese without tenderness. No hepatosplenomegaly. No masses. No hernias.   SKIN: No ulcers. No subcutaneous nodules.  MUSCULOSKELETAL: No joint swelling. No clubbing.   NEUROLOGIC: Cranial nerves II through XII are intact. No focal motor deficit. Speech was normal. Coordination movements and rapid alternating hand movements were normal.   PSYCHIATRY: Patient is alert, oriented to place, people and time. Mood and affect were flat.    LABORATORY, DIAGNOSTIC AND RADIOLOGICAL DATA: CAT scan of the head showed small focus of anterior left thalamic lacunar infarct, age is indeterminate. There is also mild periventricular and subcortical white matter changes consistent with chronic small basal ischemic disease. EKG showed normal sinus rhythm at rate of 87 per minute, right bundle branch block and possibly old inferior myocardial infarction. Serum glucose 104, BUN 12, creatinine 1.1, sodium 141, potassium 3.4. CPK 56. Troponin less than 0.02. CBC showed white count 13,000, hemoglobin 14, hematocrit 44, platelet count 149.   ASSESSMENT:  1. Temporary neurologic event characterized by difficulty speaking and formulating his words. This had subsided after 30 minutes or so. The presentation is consistent with transient ischemic attack. The lacunar infarct seen by the CAT scan I think belongs to his previous stroke in 2011. However, he has chronic ischemic small vessel disease as well.  2. Systemic hypertension.  3. Hypercholesterolemia.  4. 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 The patient does not have history of coronary artery disease or any history of heart attack in the past.  5. Mild hypokalemia. 6. Mild thrombocytopenia.   PLAN: Will admit to the medical floor with frequent neurologic examination and follow up. I will continue his home medications as listed above, however, I will change Aggrenox to Plavix. Neurology consultation. Obtain carotid ultrasound. I will also order echocardiogram to assess for any intracavitary thrombus and also to evaluate the EKG findings of possible old inferior MI. Potassium supplementation to correct the hypokalemia. I will increase ramipril dose from 5 mg to 10 mg for better blood pressure control. I spoke with the patient and his daughters regarding LIVING WILL and they confirmed that he has a LIVING WILL and he also gave the power of attorney to his  daughter, Rosana FretVictoria Whitlow.    TIME SPENT EVALUATING THIS PATIENT: Took more than 55 minutes.   ____________________________ Carney CornersAmir M. Rudene Rearwish, MD amd:cms D: 09/03/2011 04:24:42 ET T: 09/03/2011 09:10:58 ET JOB#: [id number]  cc: Carney CornersAmir M. Rudene Rearwish, MD, <Dictator> Karolee OhsAMIR Dala DockM Kerline Trahan MD ELECTRONICALLY SIGNED 09/11/2011 2:54
